# Patient Record
Sex: Female | Born: 1945 | Race: White | Hispanic: No | Marital: Married | Smoking: Never smoker
Health system: Southern US, Community
[De-identification: ages and names within clinical notes are randomized; demographics above are authoritative.]

## PROBLEM LIST (undated history)

## (undated) DIAGNOSIS — K859 Acute pancreatitis without necrosis or infection, unspecified: Secondary | ICD-10-CM

## (undated) DIAGNOSIS — K5790 Diverticulosis of intestine, part unspecified, without perforation or abscess without bleeding: Secondary | ICD-10-CM

## (undated) DIAGNOSIS — I10 Essential (primary) hypertension: Secondary | ICD-10-CM

## (undated) DIAGNOSIS — M1990 Unspecified osteoarthritis, unspecified site: Secondary | ICD-10-CM

## (undated) DIAGNOSIS — E039 Hypothyroidism, unspecified: Secondary | ICD-10-CM

## (undated) DIAGNOSIS — N189 Chronic kidney disease, unspecified: Secondary | ICD-10-CM

## (undated) DIAGNOSIS — K219 Gastro-esophageal reflux disease without esophagitis: Secondary | ICD-10-CM

## (undated) DIAGNOSIS — F419 Anxiety disorder, unspecified: Secondary | ICD-10-CM

## (undated) HISTORY — PX: CARPAL TUNNEL RELEASE: SHX101

---

## 1998-11-09 ENCOUNTER — Other Ambulatory Visit: Admission: RE | Admit: 1998-11-09 | Discharge: 1998-11-09 | Payer: Self-pay | Admitting: Internal Medicine

## 1999-10-30 ENCOUNTER — Other Ambulatory Visit: Admission: RE | Admit: 1999-10-30 | Discharge: 1999-10-30 | Payer: Self-pay | Admitting: Internal Medicine

## 2000-10-29 ENCOUNTER — Other Ambulatory Visit: Admission: RE | Admit: 2000-10-29 | Discharge: 2000-10-29 | Payer: Self-pay | Admitting: Internal Medicine

## 2004-02-20 ENCOUNTER — Other Ambulatory Visit: Admission: RE | Admit: 2004-02-20 | Discharge: 2004-02-20 | Payer: Self-pay | Admitting: Internal Medicine

## 2005-02-20 ENCOUNTER — Other Ambulatory Visit: Admission: RE | Admit: 2005-02-20 | Discharge: 2005-02-20 | Payer: Self-pay | Admitting: Family Medicine

## 2005-03-19 ENCOUNTER — Emergency Department (HOSPITAL_COMMUNITY): Admission: EM | Admit: 2005-03-19 | Discharge: 2005-03-20 | Payer: Self-pay | Admitting: Emergency Medicine

## 2007-04-22 ENCOUNTER — Other Ambulatory Visit: Admission: RE | Admit: 2007-04-22 | Discharge: 2007-04-22 | Payer: Self-pay | Admitting: Internal Medicine

## 2007-08-06 ENCOUNTER — Ambulatory Visit (HOSPITAL_BASED_OUTPATIENT_CLINIC_OR_DEPARTMENT_OTHER): Admission: RE | Admit: 2007-08-06 | Discharge: 2007-08-06 | Payer: Self-pay | Admitting: Orthopedic Surgery

## 2007-10-16 ENCOUNTER — Ambulatory Visit (HOSPITAL_BASED_OUTPATIENT_CLINIC_OR_DEPARTMENT_OTHER): Admission: RE | Admit: 2007-10-16 | Discharge: 2007-10-16 | Payer: Self-pay | Admitting: Orthopedic Surgery

## 2009-06-30 ENCOUNTER — Other Ambulatory Visit: Admission: RE | Admit: 2009-06-30 | Discharge: 2009-06-30 | Payer: Self-pay | Admitting: Internal Medicine

## 2011-04-09 NOTE — Op Note (Signed)
NAME:  Cassandra Williamson, Cassandra Williamson               ACCOUNT NO.:  [id number]   MEDICAL RECORD NO.:  [id number]          PATIENT TYPE:  AMB   LOCATION:  DSC                          FACILITY:  [facility]   PHYSICIAN:  Katy Fitch. Sypher, M.D. DATE OF BIRTH:  01-06-1946   DATE OF PROCEDURE:  08/06/2007  DATE OF DISCHARGE:                               OPERATIVE REPORT   PREOPERATIVE DIAGNOSIS:  Entrapment neuropathy median nerve, right  carpal tunnel.   POSTOPERATIVE DIAGNOSIS:  Entrapment neuropathy median nerve, right  carpal tunnel.   OPERATION:  Release of right transverse carpal ligament.   SURGEON:  Katy Fitch. Sypher, M.D.   ASSISTANT:  Marveen Reeks. Dasnoit, P.A.-C.   ANESTHESIA:  General by LMA.   SUPERVISING ANESTHESIOLOGIST:  Zenon Mayo, M.D.   INDICATIONS:  [name] is a 65 year old woman referred through the  courtesy of Dr. Kirby Funk of [facility] for evaluation and  management of hand numbness.  Clinical examination revealed signs of  bilateral carpal tunnel syndrome.  Electrodiagnostic studies completed  by Dr. Johna Roles confirmed severe right carpal tunnel syndrome and  moderately severe left carpal tunnel syndrome.  Due to a failure to  respond to nonoperative measures, she is brought to the operating room  at this time for release of her right transverse carpal ligament.   PROCEDURE:  [name] is brought to the operating room and placed in  the supine position on the operating table.  Following the induction  general anesthesia by LMA technique, the right arm was prepped with  Betadine soap solution and sterilely draped.  A pneumatic tourniquet was  applied to the proximal right brachium.  Following exsanguination of the  right arm with Esmarch bandage, the arterial tourniquet was inflated to  220 mmHg.   The procedure commenced with a short incision in the line of the ring  finger of the palm.  The patient's subcutaneous tissues were carefully  to the  palmar fascia.  This was split longitudinally to reveal the  common branch of the median nerve.  These were followed back to the  transverse carpal ligament which was gently isolated from the median  nerve.  It was then carefully separated from the dermis and the median  nerve with a Penfield 4 elevator and released with scissors along its  ulnar border extending into the distal forearm.  This widely opened the  carpal canal.  No mass or other predicaments were noted.  There were no  significant bleeding points.   The wound was then repaired with intradermal 3-0 Prolene suture.  A  compressive dressing was applied with a volar plaster splint maintaining  the wrist in 5 degrees dorsiflexion.  There were no apparent  complications.  [name] tolerated the surgery and anesthesia well.  She is transferred to the recovery room with stable vital signs.      Katy Fitch Sypher, M.D.  Electronically Signed     RVS/MEDQ  D:  08/06/2007  T:  08/06/2007  Job:  [id number]

## 2011-04-09 NOTE — Op Note (Signed)
NAME:  Cassandra Williamson, Cassandra Williamson               ACCOUNT NO.:  [id number]   MEDICAL RECORD NO.:  [id number]          PATIENT TYPE:  AMB   LOCATION:  DSC                          FACILITY:  [facility]   PHYSICIAN:  Katy Fitch. Sypher, M.D. DATE OF BIRTH:  29-Aug-1946   DATE OF PROCEDURE:  10/16/2007  DATE OF DISCHARGE:                               OPERATIVE REPORT   PREOPERATIVE DIAGNOSIS:  Entrapped neuropathy, median nerve, left carpal  tunnel.   POSTOPERATIVE DIAGNOSIS:  Entrapped neuropathy, median nerve, left  carpal tunnel.   OPERATION:  Release of left transverse carpal ligament.   SURGEON:  Katy Fitch. Sypher, M.D.   ASSISTANT:  Marveen Reeks. Dasnoit, P.A.-C.   ANESTHESIA:  General by LMA.   SUPERVISING ANESTHESIOLOGIST:  Janetta Hora. Gelene Mink, M.D.   INDICATIONS:  [name] is a 65 year old woman with history of  bilateral carpal tunnel syndrome.  She is status post release of her  right transverse carpal ligament with a very satisfactory outcome.  Preoperative electrodiagnostic studies demonstrated significant left  entrapment neuropathy of the median nerve at wrist level.  Due to a  failure to respond to nonoperative measures, she is now returned to the  operating room for release of her left transverse carpal ligament.   PROCEDURE:  [name] is brought to the operating room and placed in  the supine position on the table.   Following the induction of general anesthesia by LMA technique, the left  arm was prepped with Betadine soap and solution and sterilely draped.  A  pneumatic tourniquet was applied to the proximal left brachium.  Following exsanguination of the limb with an Esmarch bandage, the  arterial tourniquet was inflated to 220 mmHg.  The procedure commenced  with a short incision in the line of the ring finger in the palm.  The  subcutaneous tissues were carefully divided, revealing the palmar  fascia.  This was split longitudinally to the common sensory branch of  the  median nerve.  These were followed back to the transverse carpal  ligament.  The median nerve was gently isolated from the transverse  carpal ligament with the aid of a Penfield 4 Engineer, structural.  The ligament was  released subcutaneously extending into the distal forearm.  The volar  forearm fascia was likewise released subcutaneously.   This maneuver widely opened the carpal canal.  The ulnar bursa was noted  be opaque and fibrotic.  No other masses or anatomic predicaments were  encountered.   The wound was then repaired with intradermal 3-0 Prolene suture.  A  compressive dressing was applied with a volar plaster splint maintaining  the wrist in 5 degrees of dorsiflexion.  For aftercare, [name] was  provided a prescription for Percocet 5 mg 1 p.o. q.4-6h. p.r.n. pain, 20  tablets without refill.   We will see her back for followup in the office in 7-10 days for suture  removal and advancement to an exercise program and strength training.      Katy Fitch Sypher, M.D.  Electronically Signed     RVS/MEDQ  D:  10/16/2007  T:  10/16/2007  Job:  [id number]

## 2011-09-03 LAB — BASIC METABOLIC PANEL
Calcium: 9.1
Chloride: 100
Creatinine, Ser: 0.99
GFR calc non Af Amer: 57 — ABNORMAL LOW

## 2011-09-03 LAB — POCT HEMOGLOBIN-HEMACUE
Hemoglobin: 12.7
Operator id: 128471

## 2011-09-06 LAB — BASIC METABOLIC PANEL
CO2: 26
Glucose, Bld: 70
Potassium: 4.2
Sodium: 140

## 2011-09-06 LAB — POCT HEMOGLOBIN-HEMACUE
Hemoglobin: 13.4
Operator id: 208731

## 2012-08-10 ENCOUNTER — Other Ambulatory Visit (HOSPITAL_COMMUNITY)
Admission: RE | Admit: 2012-08-10 | Discharge: 2012-08-10 | Disposition: A | Discharge status: Home / Self Care | Payer: BC Managed Care – PPO | Source: Ambulatory Visit | Attending: Internal Medicine | Admitting: Internal Medicine

## 2012-08-10 DIAGNOSIS — Z01419 Encounter for gynecological examination (general) (routine) without abnormal findings: Secondary | ICD-10-CM | POA: Insufficient documentation

## 2012-08-10 DIAGNOSIS — Z1151 Encounter for screening for human papillomavirus (HPV): Secondary | ICD-10-CM | POA: Insufficient documentation

## 2012-09-25 ENCOUNTER — Encounter (HOSPITAL_COMMUNITY): Payer: Self-pay | Admitting: *Deleted

## 2012-09-25 DIAGNOSIS — K859 Acute pancreatitis without necrosis or infection, unspecified: Secondary | ICD-10-CM

## 2012-09-25 DIAGNOSIS — K5790 Diverticulosis of intestine, part unspecified, without perforation or abscess without bleeding: Secondary | ICD-10-CM

## 2012-09-25 HISTORY — DX: Acute pancreatitis without necrosis or infection, unspecified: K85.90

## 2012-09-25 HISTORY — DX: Diverticulosis of intestine, part unspecified, without perforation or abscess without bleeding: K57.90

## 2012-09-25 NOTE — Progress Notes (Signed)
09-25-12 1510 Instructed pt. For Procedure, needs meds verified, then chart ready. Pt. To call Pharmacy next week to give names and dosages of meds. Will have responsible driver and  Person x 24 hrs once  Home. Teach back method used. W. Kennon Portela

## 2012-09-28 ENCOUNTER — Encounter (HOSPITAL_COMMUNITY): Payer: Self-pay | Admitting: Pharmacy Technician

## 2012-09-29 NOTE — Progress Notes (Signed)
09-29-12 0930- Info from Dr. Kerney Elbe last EKG cone in '07-no hard copy sent.W. Kennon Portela

## 2012-10-06 ENCOUNTER — Ambulatory Visit (HOSPITAL_COMMUNITY): Payer: BC Managed Care – PPO | Admitting: Anesthesiology

## 2012-10-06 ENCOUNTER — Encounter (HOSPITAL_COMMUNITY)
Admission: RE | Disposition: A | Discharge status: Home / Self Care | Payer: Self-pay | Source: Ambulatory Visit | Attending: Gastroenterology

## 2012-10-06 ENCOUNTER — Encounter (HOSPITAL_COMMUNITY): Payer: Self-pay | Admitting: *Deleted

## 2012-10-06 ENCOUNTER — Encounter (HOSPITAL_COMMUNITY): Payer: Self-pay | Admitting: Anesthesiology

## 2012-10-06 ENCOUNTER — Ambulatory Visit (HOSPITAL_COMMUNITY)
Admission: RE | Admit: 2012-10-06 | Discharge: 2012-10-06 | Disposition: A | Discharge status: Home / Self Care | Payer: BC Managed Care – PPO | Source: Ambulatory Visit | Attending: Gastroenterology | Admitting: Gastroenterology

## 2012-10-06 DIAGNOSIS — I10 Essential (primary) hypertension: Secondary | ICD-10-CM | POA: Insufficient documentation

## 2012-10-06 DIAGNOSIS — Z79899 Other long term (current) drug therapy: Secondary | ICD-10-CM | POA: Insufficient documentation

## 2012-10-06 DIAGNOSIS — K921 Melena: Secondary | ICD-10-CM | POA: Insufficient documentation

## 2012-10-06 DIAGNOSIS — E039 Hypothyroidism, unspecified: Secondary | ICD-10-CM | POA: Insufficient documentation

## 2012-10-06 DIAGNOSIS — K219 Gastro-esophageal reflux disease without esophagitis: Secondary | ICD-10-CM | POA: Insufficient documentation

## 2012-10-06 DIAGNOSIS — D126 Benign neoplasm of colon, unspecified: Secondary | ICD-10-CM | POA: Insufficient documentation

## 2012-10-06 HISTORY — DX: Anxiety disorder, unspecified: F41.9

## 2012-10-06 HISTORY — DX: Essential (primary) hypertension: I10

## 2012-10-06 HISTORY — DX: Gastro-esophageal reflux disease without esophagitis: K21.9

## 2012-10-06 HISTORY — DX: Unspecified osteoarthritis, unspecified site: M19.90

## 2012-10-06 HISTORY — DX: Diverticulosis of intestine, part unspecified, without perforation or abscess without bleeding: K57.90

## 2012-10-06 HISTORY — DX: Acute pancreatitis without necrosis or infection, unspecified: K85.90

## 2012-10-06 HISTORY — DX: Chronic kidney disease, unspecified: N18.9

## 2012-10-06 HISTORY — DX: Hypothyroidism, unspecified: E03.9

## 2012-10-06 HISTORY — PX: COLONOSCOPY WITH PROPOFOL: SHX5780

## 2012-10-06 SURGERY — COLONOSCOPY WITH PROPOFOL
Anesthesia: Monitor Anesthesia Care

## 2012-10-06 MED ORDER — LACTATED RINGERS IV SOLN
INTRAVENOUS | Status: DC | PRN
  Administered 2012-10-06: 13:00:00 via INTRAVENOUS

## 2012-10-06 MED ORDER — KETAMINE HCL 50 MG/ML IJ SOLN
INTRAMUSCULAR | Status: DC | PRN
  Administered 2012-10-06: 50 mg via INTRAMUSCULAR

## 2012-10-06 MED ORDER — EPINEPHRINE HCL 0.1 MG/ML IJ SOLN
INTRAMUSCULAR | Status: AC
  Filled 2012-10-06: qty 10

## 2012-10-06 MED ORDER — LACTATED RINGERS IV SOLN
INTRAVENOUS | Status: DC
  Administered 2012-10-06: 1000 mL via INTRAVENOUS

## 2012-10-06 MED ORDER — FENTANYL CITRATE 0.05 MG/ML IJ SOLN
INTRAMUSCULAR | Status: DC | PRN
  Administered 2012-10-06 (×2): 50 ug via INTRAVENOUS

## 2012-10-06 MED ORDER — SODIUM CHLORIDE 0.9 % IJ SOLN
INTRAMUSCULAR | Status: DC | PRN
  Administered 2012-10-06: 14:00:00

## 2012-10-06 MED ORDER — PROPOFOL 10 MG/ML IV EMUL
INTRAVENOUS | Status: DC | PRN
  Administered 2012-10-06: 180 ug/kg/min via INTRAVENOUS

## 2012-10-06 MED ORDER — MIDAZOLAM HCL 5 MG/5ML IJ SOLN
INTRAMUSCULAR | Status: DC | PRN
  Administered 2012-10-06 (×2): 1 mg via INTRAVENOUS

## 2012-10-06 MED ORDER — PROMETHAZINE HCL 25 MG/ML IJ SOLN: 6.2500 mg | INTRAMUSCULAR | Status: DC | PRN

## 2012-10-06 SURGICAL SUPPLY — 22 items

## 2012-10-06 NOTE — H&P (Signed)
  Problem: Hematochezia.  History: The patient is a 66 year old female born [birth year]. The patient underwent a normal screening colonoscopy in August 2006. She is experiencing intermittent painless hematochezia.  I discussed with her the complications associated with colonoscopy including intestinal bleeding, intestinal perforation, and oversedation.  The patient is scheduled to undergo a diagnostic colonoscopy to evaluate hematochezia.  Chronic medications: Lisinopril. Hydrochlorothiazide. Synthroid. Lipitor. Fluoxetine. Multivitamins.  Past medical and surgical history: Bilateral carpal tunnel repair in 2008. Hypertension. Hypothyroidism. Tympanic disorder. Irritable bowel syndrome. Gastroesophageal reflux. Acute pancreatitis over 20 years. Normal screening colonoscopy in 2006.  Habits: The patient has never smoked cigarettes and occasionally consumes alcohol.  Medication allergies: Sulfa drugs cause fever and increased liver enzymes.  Exam: The patient is alert and lying comfortably on the endoscopy stretcher. Lungs are clear to auscultation. Cardiac exam reveals a regular rhythm without audible murmurs. Abdomen is soft, flat, and nontender to palpation.  Plan: Proceed with diagnostic colonoscopy to evaluate painless hematochezia as scheduled.

## 2012-10-06 NOTE — Anesthesia Postprocedure Evaluation (Signed)
Anesthesia Post Note  Patient: Cassandra Williamson  Procedure(s) Performed: Procedure(s) (LRB): COLONOSCOPY WITH PROPOFOL (N/A)  Anesthesia type: MAC  Patient location: PACU  Post pain: Pain level controlled  Post assessment: Post-op Vital signs reviewed  Last Vitals:  Filed Vitals:   10/06/12 1436  BP: 104/73  Temp:   Resp: 42    Post vital signs: Reviewed  Level of consciousness: sedated  Complications: No apparent anesthesia complications

## 2012-10-06 NOTE — Transfer of Care (Signed)
Immediate Anesthesia Transfer of Care Note  Patient: Cassandra Williamson  Procedure(s) Performed: Procedure(s) (LRB) with comments: COLONOSCOPY WITH PROPOFOL (N/A)  Patient Location: PACU  Anesthesia Type:MAC  Level of Consciousness: sedated  Airway & Oxygen Therapy: Patient Spontanous Breathing and Patient connected to face mask oxygen  Post-op Assessment: Report given to PACU RN and Post -op Vital signs reviewed and stable  Post vital signs: Reviewed and stable  Complications: No apparent anesthesia complications

## 2012-10-06 NOTE — Anesthesia Preprocedure Evaluation (Signed)
Anesthesia Evaluation  Patient identified by MRN, date of birth, ID band Patient awake    Reviewed: Allergy & Precautions, H&P , NPO status , Patient's Chart, lab work & pertinent test results  Airway Mallampati: II TM Distance: >3 FB Neck ROM: Full    Dental  (+) Teeth Intact and Dental Advisory Given   Pulmonary neg pulmonary ROS,  breath sounds clear to auscultation  Pulmonary exam normal       Cardiovascular hypertension, Rhythm:Regular     Neuro/Psych negative neurological ROS  negative psych ROS   GI/Hepatic Neg liver ROS, GERD-  ,  Endo/Other  Hypothyroidism Morbid obesity  Renal/GU Renal disease  negative genitourinary   Musculoskeletal negative musculoskeletal ROS (+)   Abdominal   Peds  Hematology negative hematology ROS (+)   Anesthesia Other Findings   Reproductive/Obstetrics negative OB ROS                           Anesthesia Physical Anesthesia Plan  ASA: II  Anesthesia Plan: MAC   Post-op Pain Management:    Induction: Intravenous  Airway Management Planned: Simple Face Mask  Additional Equipment:   Intra-op Plan:   Post-operative Plan:   Informed Consent: I have reviewed the patients History and Physical, chart, labs and discussed the procedure including the risks, benefits and alternatives for the proposed anesthesia with the patient or authorized representative who has indicated his/her understanding and acceptance.   Dental advisory given  Plan Discussed with: CRNA  Anesthesia Plan Comments:         Anesthesia Quick Evaluation

## 2012-10-06 NOTE — Op Note (Signed)
Procedure: Diagnostic colonoscopy to evaluate painless hematochezia.  Endoscopist: Danise Edge  Premedication: Propofol administered by anesthesia  Procedure: The patient was placed in the left lateral decubitus position. Anal inspection and digital rectal exam were normal. The Pentax pediatric colonoscope was introduced into the rectum and advanced to the cecum. A normal-appearing ileocecal valve and appendiceal orifice were identified. Colonic preparation for the exam today was good.  Rectum. Normal. Retroflex view of the distal rectum normal. Sigmoid colon. There is a 3 cm pedunculated polyp in the distal sigmoid colon at 20 cm from the anal verge. To achieve polyp reduction a total of 8 mL of 1:10,000 epinephrine was injected in the polyp stalk and head. Using the electrocautery snare, the polyp was removed without complications and submitted for pathological evaluation.  Descending colon. Normal.  Splenic flexure. Normal.  Transverse colon. Normal.  Hepatic flexure. Normal.  Ascending colon. Normal.  Cecum and ileocecal valve. Normal.  Assessment: A 3 cm pedunculated polyp was removed with the electrocautery snare from the distal sigmoid colon at 20 cm from the anal verge and submitted for pathological evaluation.  Recommendations: Await pathology report on the submitted distal sigmoid colon polyp.

## 2012-10-07 ENCOUNTER — Encounter (HOSPITAL_COMMUNITY): Payer: Self-pay | Admitting: Gastroenterology

## 2014-08-23 DIAGNOSIS — E785 Hyperlipidemia, unspecified: Secondary | ICD-10-CM | POA: Diagnosis not present

## 2014-08-23 DIAGNOSIS — I10 Essential (primary) hypertension: Secondary | ICD-10-CM | POA: Diagnosis not present

## 2014-08-23 DIAGNOSIS — Z0000 Encounter for general adult medical examination without abnormal findings: Secondary | ICD-10-CM | POA: Diagnosis not present

## 2014-08-23 DIAGNOSIS — E039 Hypothyroidism, unspecified: Secondary | ICD-10-CM | POA: Diagnosis not present

## 2014-08-23 DIAGNOSIS — F410 Panic disorder [episodic paroxysmal anxiety] without agoraphobia: Secondary | ICD-10-CM | POA: Diagnosis not present

## 2014-08-23 DIAGNOSIS — N183 Chronic kidney disease, stage 3 unspecified: Secondary | ICD-10-CM | POA: Diagnosis not present

## 2014-08-23 DIAGNOSIS — Z23 Encounter for immunization: Secondary | ICD-10-CM | POA: Diagnosis not present

## 2015-02-23 DIAGNOSIS — N183 Chronic kidney disease, stage 3 (moderate): Secondary | ICD-10-CM | POA: Diagnosis not present

## 2015-02-23 DIAGNOSIS — I129 Hypertensive chronic kidney disease with stage 1 through stage 4 chronic kidney disease, or unspecified chronic kidney disease: Secondary | ICD-10-CM | POA: Diagnosis not present

## 2015-04-26 ENCOUNTER — Encounter (HOSPITAL_COMMUNITY): Payer: Self-pay | Admitting: Emergency Medicine

## 2015-04-26 ENCOUNTER — Observation Stay (HOSPITAL_COMMUNITY)
Admission: EM | Admit: 2015-04-26 | Discharge: 2015-04-29 | Disposition: A | Discharge status: Home / Self Care | Payer: 59 | Attending: Internal Medicine | Admitting: Internal Medicine

## 2015-04-26 ENCOUNTER — Other Ambulatory Visit (HOSPITAL_COMMUNITY): Payer: Self-pay

## 2015-04-26 ENCOUNTER — Emergency Department (HOSPITAL_COMMUNITY): Payer: 59

## 2015-04-26 DIAGNOSIS — J329 Chronic sinusitis, unspecified: Secondary | ICD-10-CM | POA: Diagnosis present

## 2015-04-26 DIAGNOSIS — I129 Hypertensive chronic kidney disease with stage 1 through stage 4 chronic kidney disease, or unspecified chronic kidney disease: Secondary | ICD-10-CM | POA: Insufficient documentation

## 2015-04-26 DIAGNOSIS — F32A Depression, unspecified: Secondary | ICD-10-CM | POA: Diagnosis present

## 2015-04-26 DIAGNOSIS — R1110 Vomiting, unspecified: Secondary | ICD-10-CM | POA: Diagnosis not present

## 2015-04-26 DIAGNOSIS — E039 Hypothyroidism, unspecified: Secondary | ICD-10-CM | POA: Insufficient documentation

## 2015-04-26 DIAGNOSIS — Z87442 Personal history of urinary calculi: Secondary | ICD-10-CM | POA: Insufficient documentation

## 2015-04-26 DIAGNOSIS — R404 Transient alteration of awareness: Secondary | ICD-10-CM | POA: Diagnosis not present

## 2015-04-26 DIAGNOSIS — H8113 Benign paroxysmal vertigo, bilateral: Secondary | ICD-10-CM | POA: Diagnosis not present

## 2015-04-26 DIAGNOSIS — Z888 Allergy status to other drugs, medicaments and biological substances status: Secondary | ICD-10-CM | POA: Insufficient documentation

## 2015-04-26 DIAGNOSIS — I10 Essential (primary) hypertension: Secondary | ICD-10-CM

## 2015-04-26 DIAGNOSIS — F329 Major depressive disorder, single episode, unspecified: Secondary | ICD-10-CM | POA: Diagnosis present

## 2015-04-26 DIAGNOSIS — Z882 Allergy status to sulfonamides status: Secondary | ICD-10-CM | POA: Insufficient documentation

## 2015-04-26 DIAGNOSIS — R42 Dizziness and giddiness: Principal | ICD-10-CM

## 2015-04-26 DIAGNOSIS — K219 Gastro-esophageal reflux disease without esophagitis: Secondary | ICD-10-CM | POA: Insufficient documentation

## 2015-04-26 DIAGNOSIS — R112 Nausea with vomiting, unspecified: Secondary | ICD-10-CM

## 2015-04-26 DIAGNOSIS — F419 Anxiety disorder, unspecified: Secondary | ICD-10-CM | POA: Diagnosis not present

## 2015-04-26 DIAGNOSIS — J324 Chronic pansinusitis: Secondary | ICD-10-CM

## 2015-04-26 DIAGNOSIS — H8309 Labyrinthitis, unspecified ear: Secondary | ICD-10-CM | POA: Diagnosis present

## 2015-04-26 DIAGNOSIS — N189 Chronic kidney disease, unspecified: Secondary | ICD-10-CM | POA: Insufficient documentation

## 2015-04-26 DIAGNOSIS — E785 Hyperlipidemia, unspecified: Secondary | ICD-10-CM | POA: Diagnosis present

## 2015-04-26 LAB — COMPREHENSIVE METABOLIC PANEL
ALBUMIN: 3.9 g/dL (ref 3.5–5.0)
ALK PHOS: 63 U/L (ref 38–126)
ALT: 37 U/L (ref 14–54)
AST: 30 U/L (ref 15–41)
Anion gap: 11 (ref 5–15)
BUN: 16 mg/dL (ref 6–20)
CALCIUM: 9.1 mg/dL (ref 8.9–10.3)
CO2: 25 mmol/L (ref 22–32)
Chloride: 102 mmol/L (ref 101–111)
Creatinine, Ser: 0.97 mg/dL (ref 0.44–1.00)
GFR calc Af Amer: >60 mL/min (ref >60)
GFR, EST NON AFRICAN AMERICAN: 59 mL/min — AB (ref >60)
Glucose, Bld: 138 mg/dL — ABNORMAL HIGH (ref 65–99)
Potassium: 4.3 mmol/L (ref 3.5–5.1)
Sodium: 138 mmol/L (ref 135–145)
Total Bilirubin: 0.4 mg/dL (ref 0.3–1.2)
Total Protein: 7.1 g/dL (ref 6.5–8.1)

## 2015-04-26 LAB — URINALYSIS, ROUTINE W REFLEX MICROSCOPIC
Bilirubin Urine: NEGATIVE
Glucose, UA: NEGATIVE mg/dL
Hgb urine dipstick: NEGATIVE
Ketones, ur: NEGATIVE mg/dL
Leukocytes, UA: NEGATIVE
Nitrite: NEGATIVE
Protein, ur: NEGATIVE mg/dL
SPECIFIC GRAVITY, URINE: 1.024 (ref 1.005–1.030)
UROBILINOGEN UA: 0.2 mg/dL (ref 0.0–1.0)
pH: 7 (ref 5.0–8.0)

## 2015-04-26 LAB — CBC
HCT: 37 % (ref 36.0–46.0)
Hemoglobin: 12.2 g/dL (ref 12.0–15.0)
MCH: 29.8 pg (ref 26.0–34.0)
MCHC: 33 g/dL (ref 30.0–36.0)
MCV: 90.2 fL (ref 78.0–100.0)
PLATELETS: 308 10*3/uL (ref 150–400)
RBC: 4.1 MIL/uL (ref 3.87–5.11)
RDW: 12.8 % (ref 11.5–15.5)
WBC: 6.4 10*3/uL (ref 4.0–10.5)

## 2015-04-26 LAB — I-STAT CHEM 8, ED
BUN: 20 mg/dL (ref 6–20)
CHLORIDE: 103 mmol/L (ref 101–111)
Calcium, Ion: 1.14 mmol/L (ref 1.13–1.30)
Creatinine, Ser: 0.9 mg/dL (ref 0.44–1.00)
Glucose, Bld: 134 mg/dL — ABNORMAL HIGH (ref 65–99)
HCT: 40 % (ref 36.0–46.0)
HEMOGLOBIN: 13.6 g/dL (ref 12.0–15.0)
Potassium: 4.3 mmol/L (ref 3.5–5.1)
Sodium: 140 mmol/L (ref 135–145)
TCO2: 21 mmol/L (ref 0–100)

## 2015-04-26 LAB — DIFFERENTIAL
Basophils Absolute: 0 10*3/uL (ref 0.0–0.1)
Basophils Relative: 0 % (ref 0–1)
EOS ABS: 0 10*3/uL (ref 0.0–0.7)
Eosinophils Relative: 0 % (ref 0–5)
LYMPHS ABS: 0.6 10*3/uL — AB (ref 0.7–4.0)
Lymphocytes Relative: 9 % — ABNORMAL LOW (ref 12–46)
MONO ABS: 0.2 10*3/uL (ref 0.1–1.0)
MONOS PCT: 3 % (ref 3–12)
Neutro Abs: 5.6 10*3/uL (ref 1.7–7.7)
Neutrophils Relative %: 88 % — ABNORMAL HIGH (ref 43–77)

## 2015-04-26 LAB — APTT: aPTT: 25 seconds (ref 24–37)

## 2015-04-26 LAB — I-STAT TROPONIN, ED: TROPONIN I, POC: 0 ng/mL (ref 0.00–0.08)

## 2015-04-26 LAB — RAPID URINE DRUG SCREEN, HOSP PERFORMED
Amphetamines: NOT DETECTED
BARBITURATES: NOT DETECTED
Benzodiazepines: NOT DETECTED
COCAINE: NOT DETECTED
OPIATES: NOT DETECTED
Tetrahydrocannabinol: NOT DETECTED

## 2015-04-26 LAB — PROTIME-INR
INR: 1.07 (ref 0.00–1.49)
PROTHROMBIN TIME: 14.1 s (ref 11.6–15.2)

## 2015-04-26 LAB — ETHANOL

## 2015-04-26 MED ORDER — FLUTICASONE PROPIONATE 50 MCG/ACT NA SUSP
2.0000 | Freq: Every day | NASAL | Status: DC
  Administered 2015-04-27 – 2015-04-29 (×3): 2 via NASAL
  Filled 2015-04-26 (×2): qty 16

## 2015-04-26 MED ORDER — CALCIUM CARBONATE ANTACID 500 MG PO CHEW
1.0000 | CHEWABLE_TABLET | ORAL | Status: DC | PRN
  Administered 2015-04-27 – 2015-04-29 (×5): 200 mg via ORAL
  Filled 2015-04-26 (×5): qty 1

## 2015-04-26 MED ORDER — ATORVASTATIN CALCIUM 40 MG PO TABS
40.0000 mg | ORAL_TABLET | Freq: Every evening | ORAL | Status: DC
  Administered 2015-04-27 – 2015-04-28 (×2): 40 mg via ORAL
  Filled 2015-04-26 (×2): qty 1

## 2015-04-26 MED ORDER — LORAZEPAM 1 MG PO TABS: 1.0000 mg | ORAL_TABLET | ORAL | Status: DC | PRN

## 2015-04-26 MED ORDER — LORAZEPAM 1 MG PO TABS
1.0000 mg | ORAL_TABLET | Freq: Once | ORAL | Status: AC
Start: 2015-04-26 — End: 2015-04-26
  Administered 2015-04-26: 1 mg via ORAL
  Filled 2015-04-26: qty 1

## 2015-04-26 MED ORDER — ONDANSETRON HCL 4 MG/2ML IJ SOLN
4.0000 mg | Freq: Once | INTRAMUSCULAR | Status: AC
  Administered 2015-04-26: 4 mg via INTRAVENOUS
  Filled 2015-04-26: qty 2

## 2015-04-26 MED ORDER — ADULT MULTIVITAMIN W/MINERALS CH
1.0000 | ORAL_TABLET | Freq: Every day | ORAL | Status: DC
  Administered 2015-04-27 – 2015-04-29 (×3): 1 via ORAL
  Filled 2015-04-26 (×3): qty 1

## 2015-04-26 MED ORDER — FLUOXETINE HCL 20 MG PO CAPS
40.0000 mg | ORAL_CAPSULE | Freq: Every day | ORAL | Status: DC
  Administered 2015-04-27 – 2015-04-29 (×3): 40 mg via ORAL
  Filled 2015-04-26 (×3): qty 2

## 2015-04-26 MED ORDER — NAPROXEN 250 MG PO TABS
250.0000 mg | ORAL_TABLET | Freq: Every day | ORAL | Status: DC | PRN
  Administered 2015-04-27 – 2015-04-28 (×2): 250 mg via ORAL
  Filled 2015-04-26 (×2): qty 1

## 2015-04-26 MED ORDER — HYDROCODONE-ACETAMINOPHEN 5-325 MG PO TABS
1.0000 | ORAL_TABLET | Freq: Once | ORAL | Status: AC
  Administered 2015-04-26: 1 via ORAL
  Filled 2015-04-26: qty 1

## 2015-04-26 MED ORDER — FAMOTIDINE 20 MG PO TABS
20.0000 mg | ORAL_TABLET | Freq: Two times a day (BID) | ORAL | Status: DC
  Administered 2015-04-26 – 2015-04-29 (×6): 20 mg via ORAL
  Filled 2015-04-26 (×6): qty 1

## 2015-04-26 MED ORDER — AMLODIPINE BESYLATE 5 MG PO TABS
5.0000 mg | ORAL_TABLET | Freq: Every day | ORAL | Status: DC
  Administered 2015-04-28 – 2015-04-29 (×2): 5 mg via ORAL
  Filled 2015-04-26 (×3): qty 1

## 2015-04-26 MED ORDER — LEVOTHYROXINE SODIUM 88 MCG PO TABS
88.0000 ug | ORAL_TABLET | Freq: Every day | ORAL | Status: DC
  Administered 2015-04-27 – 2015-04-29 (×3): 88 ug via ORAL
  Filled 2015-04-26 (×3): qty 1

## 2015-04-26 MED ORDER — MECLIZINE HCL 12.5 MG PO TABS
25.0000 mg | ORAL_TABLET | Freq: Three times a day (TID) | ORAL | Status: DC | PRN
  Administered 2015-04-27: 25 mg via ORAL
  Filled 2015-04-26: qty 2

## 2015-04-26 MED ORDER — LISINOPRIL 20 MG PO TABS
20.0000 mg | ORAL_TABLET | Freq: Every day | ORAL | Status: DC
  Administered 2015-04-28 – 2015-04-29 (×2): 20 mg via ORAL
  Filled 2015-04-26 (×3): qty 1

## 2015-04-26 MED ORDER — COLCHICINE 0.6 MG PO TABS
0.6000 mg | ORAL_TABLET | Freq: Every day | ORAL | Status: DC
Start: 2015-04-27 — End: 2015-04-29
  Filled 2015-04-26 (×3): qty 1

## 2015-04-26 NOTE — ED Notes (Signed)
Family at bedside. 

## 2015-04-26 NOTE — H&P (Signed)
[facility] History and Physical  Cassandra Williamson:[id number] DOB: October 11, 1946 DOA: 04/26/2015  Referring physician: EDP PCP: Irven Shelling, MD   Chief Complaint: Dizziness   HPI: Cassandra Williamson is a 69 y.o. female who presents to the ED for evaluation of dizziness.  Symptoms onset yesterday.  She describes it as a "drunk feeling" that worsens when she moves her eyes, her head, or tries to walk.  Recent nasal congestion due to allergies.  No history of vertigo previously.  No headache, focal weakness, paresthesias, chest pain, SOB.  Walking is difficult due to being off ballance.  There is associated nausea and 1 episode of vomiting with trying to walk in the ED.  Review of Systems: Systems reviewed.  As above, otherwise negative  Past Medical History  Diagnosis Date  . Hypertension   . Hypothyroidism   . Anxiety     panic attacks, less recent since tx. Prozac  . Chronic kidney disease     hx. kidney stone at present-no problems x7 yrs  . GERD (gastroesophageal reflux disease)     tx. Zantac, TUMS  . Pancreatitis 09-25-12    30 yrs ago due to BCP pill.  . Diverticulosis 09-25-12    hx. Irritable bowel syndrome  . Arthritis     lower back   Past Surgical History  Procedure Laterality Date  . Carpal tunnel release      bil.  . Colonoscopy with propofol  10/06/2012    Procedure: COLONOSCOPY WITH PROPOFOL;  Surgeon: Garlan Fair, MD;  Location: [facility];  Service: Endoscopy;  Laterality: N/A;   Social History:  reports that she has never smoked. She does not have any smokeless tobacco history on file. She reports that she drinks alcohol. She reports that she does not use illicit drugs.  Allergies  Allergen Reactions  . Sulfa Antibiotics Other (See Comments)    Poisons my internal organs.  . Benadryl [Diphenhydramine Hcl]     Causes insomnia    No family history on file.   Prior to Admission medications   Medication Sig Start Date End Date Taking?  Authorizing Provider  amLODipine (NORVASC) 5 MG tablet Take 5 mg by mouth daily.   Yes Historical Provider, MD  atorvastatin (LIPITOR) 40 MG tablet Take 40 mg by mouth every evening.   Yes Historical Provider, MD  calcium carbonate (TUMS - DOSED IN MG ELEMENTAL CALCIUM) 500 MG chewable tablet Chew 1 tablet by mouth as needed. heartburn   Yes Historical Provider, MD  colchicine 0.6 MG tablet Take 0.6 mg by mouth daily.   Yes Historical Provider, MD  FLUoxetine (PROZAC) 40 MG capsule Take 40 mg by mouth daily before breakfast.   Yes Historical Provider, MD  levothyroxine (SYNTHROID, LEVOTHROID) 88 MCG tablet Take 88 mcg by mouth daily before breakfast.   Yes Historical Provider, MD  lisinopril (PRINIVIL,ZESTRIL) 20 MG tablet Take 20 mg by mouth daily.   Yes Historical Provider, MD  Multiple Vitamin (MULTIVITAMIN WITH MINERALS) TABS Take 1 tablet by mouth daily.   Yes Historical Provider, MD  naproxen sodium (ANAPROX) 220 MG tablet Take 220 mg by mouth daily as needed. For pain   Yes Historical Provider, MD  ranitidine (ZANTAC) 150 MG tablet Take 150 mg by mouth 2 (two) times daily.   Yes Historical Provider, MD   Physical Exam: Filed Vitals:   04/26/15 2230  BP: 110/51  Pulse: 86  Temp:   Resp: 17    BP 110/51 mmHg  Pulse  86  Temp(Src) 98.1 F (36.7 C) (Oral)  Resp 17  Ht 5\' 2"  (1.575 m)  Wt 98.884 kg (218 lb)  BMI 39.86 kg/m2  SpO2 90%  General Appearance:    Alert, oriented, no distress, appears stated age  Head:    Normocephalic, atraumatic  Eyes:    Right horizontal nystagmus, worsens with eye motion to left or right.       Nose:   Nares without drainage or epistaxis. Mucosa, turbinates normal  Throat:   Moist mucous membranes. Oropharynx without erythema or exudate.  Neck:   Supple. No carotid bruits.  No thyromegaly.  No lymphadenopathy.   Back:     No CVA tenderness, no spinal tenderness  Lungs:     Clear to auscultation bilaterally, without wheezes, rhonchi or rales   Chest wall:    No tenderness to palpitation  Heart:    Regular rate and rhythm without murmurs, gallops, rubs  Abdomen:     Soft, non-tender, nondistended, normal bowel sounds, no organomegaly  Genitalia:    deferred  Rectal:    deferred  Extremities:   No clubbing, cyanosis or edema.  Pulses:   2+ and symmetric all extremities  Skin:   Skin color, texture, turgor normal, no rashes or lesions  Lymph nodes:   Cervical, supraclavicular, and axillary nodes normal  Neurologic:   CNII-XII intact. Normal strength, sensation and reflexes      throughout    Labs on Admission:  Basic Metabolic Panel:  Recent Labs Lab 04/26/15 1627 04/26/15 1640  NA 138 140  K 4.3 4.3  CL 102 103  CO2 25  --   GLUCOSE 138* 134*  BUN 16 20  CREATININE 0.97 0.90  CALCIUM 9.1  --    Liver Function Tests:  Recent Labs Lab 04/26/15 1627  AST 30  ALT 37  ALKPHOS 63  BILITOT 0.4  PROT 7.1  ALBUMIN 3.9   No results for input(s): LIPASE, AMYLASE in the last 168 hours. No results for input(s): AMMONIA in the last 168 hours. CBC:  Recent Labs Lab 04/26/15 1627 04/26/15 1640  WBC 6.4  --   NEUTROABS 5.6  --   HGB 12.2 13.6  HCT 37.0 40.0  MCV 90.2  --   PLT 308  --    Cardiac Enzymes: No results for input(s): CKTOTAL, CKMB, CKMBINDEX, TROPONINI in the last 168 hours.  BNP (last 3 results) No results for input(s): PROBNP in the last 8760 hours. CBG: No results for input(s): GLUCAP in the last 168 hours.  Radiological Exams on Admission: Ct Head Wo Contrast  04/26/2015   CLINICAL DATA:  Dizziness.  Vertigo.  Vomiting.  EXAM: CT HEAD WITHOUT CONTRAST  TECHNIQUE: Contiguous axial images were obtained from the base of the skull through the vertex without intravenous contrast.  COMPARISON:  None.  FINDINGS: No evidence of intracranial hemorrhage, brain edema, or other signs of acute infarction. No evidence of intracranial mass lesion or mass effect.  No abnormal extraaxial fluid collections  identified. Ventricles are normal in size. No skull abnormality identified.  Complete opacification of the right maxillary sinuses noted with associated hyperostosis of the walls of the right maxillary sinus. There is also mild mucosal thickening involving the ethmoid sinuses bilaterally.  IMPRESSION: No intracranial abnormality identified.  Chronic sinusitis, with complete opacification and hyperostosis of the right maxillary sinus.   Electronically Signed   By: Earle Gell M.D.   On: 04/26/2015 18:10    EKG: Independently reviewed.  Assessment/Plan Principal Problem:   Benign positional vertigo Active Problems:   Sinusitis   1. BPV - 1. Starting meclizine PRN, ativan PRN if symptoms are very severe and associated with N/V 2. PT eval for Epley maneuvers tomorrow 2. Sinusitis - continue home meds, also adding flonase    Code Status: Full Code  Family Communication: Husband at bedside Disposition Plan: Admit to obs   Time spent: 30 min  GARDNER, JARED M. [facility] Pager [phone]  If 7AM-7PM, please contact the day team taking care of the patient [email or web address] Password [email or web address] 04/26/2015, 10:43 PM

## 2015-04-26 NOTE — ED Provider Notes (Signed)
CSN: [id number]     Arrival date & time 04/26/15  1548 History   First MD Initiated Contact with Patient 04/26/15 1604     Chief Complaint  Patient presents with  . Dizziness     (Consider location/radiation/quality/duration/timing/severity/associated sxs/prior Treatment) HPI   Cassandra Williamson is a 69 y.o. female who presents for evaluation of dizziness which started yesterday. She describes it as a drunk feeling that worsens when she moves her eyes. Her head or tries to walk. She recently has had nasal congestion that she treated to allergies. He has not had vertigo previously. She denies headache, focal weakness, paresthesias, chest pain or shortness of breath. She states that walking is difficult because she feels off balance. She is taking her usual medications. There are no other known modifying factors.   Past Medical History  Diagnosis Date  . Hypertension   . Hypothyroidism   . Anxiety     panic attacks, less recent since tx. Prozac  . Chronic kidney disease     hx. kidney stone at present-no problems x7 yrs  . GERD (gastroesophageal reflux disease)     tx. Zantac, TUMS  . Pancreatitis 09-25-12    30 yrs ago due to BCP pill.  . Diverticulosis 09-25-12    hx. Irritable bowel syndrome  . Arthritis     lower back   Past Surgical History  Procedure Laterality Date  . Carpal tunnel release      bil.  . Colonoscopy with propofol  10/06/2012    Procedure: COLONOSCOPY WITH PROPOFOL;  Surgeon: Garlan Fair, MD;  Location: [facility];  Service: Endoscopy;  Laterality: N/A;   No family history on file. History  Substance Use Topics  . Smoking status: Never Smoker   . Smokeless tobacco: Not on file  . Alcohol Use: Yes     Comment: rare -social   OB History    No data available     Review of Systems  All other systems reviewed and are negative.     Allergies  Sulfa antibiotics and Benadryl  Home Medications   Prior to Admission medications   Medication Sig  Start Date End Date Taking? Authorizing Provider  amLODipine (NORVASC) 5 MG tablet Take 5 mg by mouth daily.   Yes Historical Provider, MD  atorvastatin (LIPITOR) 40 MG tablet Take 40 mg by mouth every evening.   Yes Historical Provider, MD  calcium carbonate (TUMS - DOSED IN MG ELEMENTAL CALCIUM) 500 MG chewable tablet Chew 1 tablet by mouth as needed. heartburn   Yes Historical Provider, MD  colchicine 0.6 MG tablet Take 0.6 mg by mouth daily.   Yes Historical Provider, MD  FLUoxetine (PROZAC) 40 MG capsule Take 40 mg by mouth daily before breakfast.   Yes Historical Provider, MD  levothyroxine (SYNTHROID, LEVOTHROID) 88 MCG tablet Take 88 mcg by mouth daily before breakfast.   Yes Historical Provider, MD  lisinopril (PRINIVIL,ZESTRIL) 20 MG tablet Take 20 mg by mouth daily.   Yes Historical Provider, MD  Multiple Vitamin (MULTIVITAMIN WITH MINERALS) TABS Take 1 tablet by mouth daily.   Yes Historical Provider, MD  naproxen sodium (ANAPROX) 220 MG tablet Take 220 mg by mouth daily as needed. For pain   Yes Historical Provider, MD  ranitidine (ZANTAC) 150 MG tablet Take 150 mg by mouth 2 (two) times daily.   Yes Historical Provider, MD   BP 127/63 mmHg  Pulse 102  Temp(Src) 98.1 F (36.7 C) (Oral)  Resp 13  Ht  5\' 2"  (1.575 m)  Wt 218 lb (98.884 kg)  BMI 39.86 kg/m2  SpO2 93% Physical Exam  Constitutional: She is oriented to person, place, and time. She appears well-developed and well-nourished.  HENT:  Head: Normocephalic and atraumatic.  Right Ear: External ear normal.  Left Ear: External ear normal.  Eyes: Conjunctivae and EOM are normal. Pupils are equal, round, and reactive to light.  Neck: Normal range of motion and phonation normal. Neck supple.  Cardiovascular: Normal rate, regular rhythm and normal heart sounds.   Pulmonary/Chest: Effort normal and breath sounds normal. She exhibits no bony tenderness.  Abdominal: Soft. There is no tenderness.  Musculoskeletal: Normal range  of motion.  Neurological: She is alert and oriented to person, place, and time. No cranial nerve deficit or sensory deficit. She exhibits normal muscle tone. Coordination normal.  No dysarthria or aphasia. Right beating horizontal nystagmus, with bilateral eye motion.  Skin: Skin is warm, dry and intact.  Psychiatric: She has a normal mood and affect. Her behavior is normal. Judgment and thought content normal.  Nursing note and vitals reviewed.   ED Course  Procedures (including critical care time)  Medications  LORazepam (ATIVAN) tablet 1 mg (1 mg Oral Given 04/26/15 1633)  ondansetron (ZOFRAN) injection 4 mg (4 mg Intravenous Given 04/26/15 1754)  HYDROcodone-acetaminophen (NORCO/VICODIN) 5-325 MG per tablet 1 tablet (1 tablet Oral Given 04/26/15 2101)    Patient Vitals for the past 24 hrs:  BP Temp Temp src Pulse Resp SpO2 Height Weight  04/26/15 2045 127/63 mmHg - - 102 13 93 % - -  04/26/15 2015 109/55 mmHg - - 97 14 95 % - -  04/26/15 2000 114/68 mmHg - - 96 14 93 % - -  04/26/15 1945 132/63 mmHg - - 99 14 93 % - -  04/26/15 1930 120/66 mmHg - - 96 15 93 % - -  04/26/15 1915 117/65 mmHg - - 95 18 96 % - -  04/26/15 1900 124/64 mmHg - - 98 14 94 % - -  04/26/15 1845 124/60 mmHg - - 94 15 95 % - -  04/26/15 1815 (!) 121/54 mmHg - - 89 14 93 % - -  04/26/15 1730 138/64 mmHg - - 99 14 96 % - -  04/26/15 1715 (!) 122/50 mmHg - - 95 15 94 % - -  04/26/15 1700 135/58 mmHg - - 97 17 93 % - -  04/26/15 1645 (!) 126/46 mmHg - - 93 (!) 0 92 % - -  04/26/15 1630 (!) 123/54 mmHg - - 93 11 95 % - -  04/26/15 1615 129/59 mmHg - - 99 19 94 % - -  04/26/15 1558 126/61 mmHg 98.1 F (36.7 C) Oral 95 15 97 % 5\' 2"  (1.575 m) 218 lb (98.884 kg)  04/26/15 1553 - - - - - 95 % - -    8:55PM Reevaluation with update and discussion. After initial assessment and treatment, an updated evaluation reveals he states that she has not had any relief from the sinus, after receiving Ativan. She is also still  nauseated. She feels like her head is "too big".  She would like pain medication. [name]   21:50 - an attempt to emulate the patient failed because, the attempt initiated, vomiting, retching, and she was unable to stand up. Reported that the effort made her feel dizzy.  10:13 PM-Consult complete with Dr. Chip Boer. Patient case explained and discussed. He agrees to admit patient for further  evaluation and treatment. Call ended at 2123    Labs Review Labs Reviewed  DIFFERENTIAL - Abnormal; Notable for the following:    Neutrophils Relative % 88 (*)    Lymphocytes Relative 9 (*)    Lymphs Abs 0.6 (*)    All other components within normal limits  COMPREHENSIVE METABOLIC PANEL - Abnormal; Notable for the following:    Glucose, Bld 138 (*)    GFR calc non Af Amer 59 (*)    All other components within normal limits  I-STAT CHEM 8, ED - Abnormal; Notable for the following:    Glucose, Bld 134 (*)    All other components within normal limits  ETHANOL  PROTIME-INR  APTT  CBC  URINE RAPID DRUG SCREEN (HOSP PERFORMED) NOT AT [facility]  URINALYSIS, ROUTINE W REFLEX MICROSCOPIC (NOT AT [facility])  I-STAT TROPOININ, ED    Imaging Review Ct Head Wo Contrast  04/26/2015   CLINICAL DATA:  Dizziness.  Vertigo.  Vomiting.  EXAM: CT HEAD WITHOUT CONTRAST  TECHNIQUE: Contiguous axial images were obtained from the base of the skull through the vertex without intravenous contrast.  COMPARISON:  None.  FINDINGS: No evidence of intracranial hemorrhage, brain edema, or other signs of acute infarction. No evidence of intracranial mass lesion or mass effect.  No abnormal extraaxial fluid collections identified. Ventricles are normal in size. No skull abnormality identified.  Complete opacification of the right maxillary sinuses noted with associated hyperostosis of the walls of the right maxillary sinus. There is also mild mucosal thickening involving the ethmoid sinuses bilaterally.  IMPRESSION: No intracranial  abnormality identified.  Chronic sinusitis, with complete opacification and hyperostosis of the right maxillary sinus.   Electronically Signed   By: Earle Gell M.D.   On: 04/26/2015 18:10     EKG Interpretation None      MDM   Final diagnoses:  Pansinusitis, unspecified chronicity  Nausea and vomiting, vomiting of unspecified type  Dizziness     Positional dizziness likely related to sinusitis. Doubt CVA, serious bacterial infection or metabolic instability. He should feel treatment in the ED, and is unable to ambulate, so will seek admission for treatment and stabilization.   Nursing Notes Reviewed/ Care Coordinated, and agree without changes. Applicable Imaging Reviewed.  Interpretation of Laboratory Data incorporated into ED treatment  Plan: Admit  Daleen Bo, MD 04/26/15 2225

## 2015-04-26 NOTE — ED Notes (Signed)
Pt vomiting in CT. Zofran given in CT.

## 2015-04-26 NOTE — ED Notes (Signed)
MD at bedside. 

## 2015-04-26 NOTE — ED Notes (Addendum)
Pt states yesterday while a work she felt dizzy and off balance. Pt states while walking at work yesterday she felt "drunk" This morning she could barely get out of bed from being dizzy. Pt had one episode of vomiting and diarrhea this morning. 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

## 2015-04-26 NOTE — Progress Notes (Signed)
Patient arrived to 4N20 from ED. Alert and oriented x 4. Oriented to room and unit. Husband at bedside. Will continue to monitor closely. Burnell Blanks, RN

## 2015-04-26 NOTE — ED Notes (Addendum)
Attempted to help pt stand up in order to ambulate down the hall and pt became nauseous and vomited once and continues to dry heave. Dr.wentz made aware.

## 2015-04-27 ENCOUNTER — Observation Stay (HOSPITAL_COMMUNITY): Payer: 59

## 2015-04-27 DIAGNOSIS — F32A Depression, unspecified: Secondary | ICD-10-CM | POA: Diagnosis present

## 2015-04-27 DIAGNOSIS — G939 Disorder of brain, unspecified: Secondary | ICD-10-CM | POA: Diagnosis not present

## 2015-04-27 DIAGNOSIS — F329 Major depressive disorder, single episode, unspecified: Secondary | ICD-10-CM | POA: Diagnosis present

## 2015-04-27 DIAGNOSIS — R112 Nausea with vomiting, unspecified: Secondary | ICD-10-CM | POA: Diagnosis not present

## 2015-04-27 DIAGNOSIS — R42 Dizziness and giddiness: Secondary | ICD-10-CM

## 2015-04-27 DIAGNOSIS — I10 Essential (primary) hypertension: Secondary | ICD-10-CM

## 2015-04-27 DIAGNOSIS — E785 Hyperlipidemia, unspecified: Secondary | ICD-10-CM | POA: Diagnosis present

## 2015-04-27 MED ORDER — PROMETHAZINE HCL 25 MG/ML IJ SOLN
12.5000 mg | Freq: Four times a day (QID) | INTRAMUSCULAR | Status: DC | PRN
  Filled 2015-04-27: qty 1

## 2015-04-27 MED ORDER — MECLIZINE HCL 12.5 MG PO TABS
50.0000 mg | ORAL_TABLET | Freq: Three times a day (TID) | ORAL | Status: DC
  Administered 2015-04-27 – 2015-04-29 (×6): 50 mg via ORAL
  Filled 2015-04-27 (×8): qty 4

## 2015-04-27 MED ORDER — ENOXAPARIN SODIUM 40 MG/0.4ML ~~LOC~~ SOLN
40.0000 mg | SUBCUTANEOUS | Status: DC
  Administered 2015-04-27 – 2015-04-29 (×3): 40 mg via SUBCUTANEOUS
  Filled 2015-04-27 (×3): qty 0.4

## 2015-04-27 MED ORDER — LORAZEPAM 2 MG/ML IJ SOLN
1.0000 mg | Freq: Once | INTRAMUSCULAR | Status: AC
  Administered 2015-04-27: 1 mg via INTRAVENOUS
  Filled 2015-04-27: qty 1

## 2015-04-27 NOTE — Evaluation (Signed)
Physical Therapy Evaluation Patient Details Name: Cassandra Williamson MRN: [id number] DOB: October 29, 1946 Today's Date: 04/27/2015   History of Present Illness  Patient is a 69 yo female admitted 04/26/15 with dizziness, N/V, and difficulty walking.  PMH:  HTN, anxiety, arthritis  Clinical Impression  Patient presents with problems listed below.  Will benefit from acute PT to maximize functional mobility prior to return home with husband.  Today, patient with 10/10 dizziness while sitting EOB.  Patient with ongoing Right beating horizontal nystagmus.  Patient unable to tolerate full testing due to dizziness/nausea.  Will return later pm to continue as patient tolerates.      Follow Up Recommendations Outpatient PT;Supervision/Assistance - 24 hour (For Vestibular Rehab)    Equipment Recommendations  Rolling walker with 5" wheels    Recommendations for Other Services       Precautions / Restrictions Precautions Precautions: Fall Precaution Comments: Dizziness Restrictions Weight Bearing Restrictions: No      Mobility  Bed Mobility Overal bed mobility: Needs Assistance Bed Mobility: Rolling;Sidelying to Sit;Sit to Sidelying Rolling: Supervision Sidelying to sit: Min guard     Sit to sidelying: Min guard General bed mobility comments: As PT entered room, room was dark - patient reports light "bothers" her.  In supine, noted patient with horizontal nystagmus to right.  Verbal cues for mobility technique.  Assist for balance/safety during mobility/transitions.  In sitting, patient required close min guard due to dizziness.  Continued to note right beating horizontal nystagmus.  Patient able to sit EOB x5 minutes with nystagmus ongoing.  Attempted smooth pursuits - difficult to assess.  Patient reported dizziness and nausea increasing with sitting.  Asked to return to sidelying.    Transfers                 General transfer comment: NT per patient request to return to  sidelying.  Ambulation/Gait             General Gait Details: NT - patient did report that 2 nursing staff assisted her to bathroom earlier.  Stairs            Wheelchair Mobility    Modified Rankin (Stroke Patients Only)       Balance Overall balance assessment: Needs assistance Sitting-balance support: Single extremity supported;Feet supported Sitting balance-Leahy Scale: Poor Sitting balance - Comments: Requires support of UE to maintain balance.  Patient reports feeling like she is falling to left.                                       Pertinent Vitals/Pain Pain Assessment: 0-10 Pain Score: 5  Pain Location: Head, above eyes Pain Descriptors / Indicators: Aching;Headache;Pressure (Fullness) Pain Intervention(s): Limited activity within patient's tolerance;Repositioned    Home Living Family/patient expects to be discharged to:: Private residence Living Arrangements: Spouse/significant other Available Help at Discharge: Family;Available 24 hours/day Type of Home: House Home Access: Stairs to enter Entrance Stairs-Rails: None Entrance Stairs-Number of Steps: 1 Home Layout: One level Home Equipment: None      Prior Function Level of Independence: Independent         Comments: Drives; Works as bookkeeper     Journalist, newspaper        Extremity/Trunk Assessment   Upper Extremity Assessment: Overall WFL for tasks assessed           Lower Extremity Assessment: Overall WFL for tasks assessed  Cervical / Trunk Assessment: Normal  Communication   Communication: No difficulties  Cognition Arousal/Alertness: Awake/alert Behavior During Therapy: WFL for tasks assessed/performed Overall Cognitive Status: Within Functional Limits for tasks assessed                      General Comments      Exercises        Assessment/Plan    PT Assessment Patient needs continued PT services  PT Diagnosis Difficulty  walking;Acute pain (Dizziness)   PT Problem List Decreased activity tolerance;Decreased balance;Decreased mobility;Pain  PT Treatment Interventions DME instruction;Gait training;Functional mobility training;Therapeutic activities;Therapeutic exercise;Balance training;Neuromuscular re-education;Patient/family education   PT Goals (Current goals can be found in the Care Plan section) Acute Rehab PT Goals Patient Stated Goal: To feel better PT Goal Formulation: With patient Time For Goal Achievement: 05/04/15 Potential to Achieve Goals: Good    Frequency Min 4X/week   Barriers to discharge        Co-evaluation               End of Session   Activity Tolerance: Patient limited by fatigue;Patient limited by pain (Limited by dizziness) Patient left: in bed;with call bell/phone within reach;with bed alarm set Nurse Communication: Mobility status;Patient requests pain meds (Dizziness medicine)    Functional Assessment Tool Used: Clinical judgement Functional Limitation: Mobility: Walking and moving around Mobility: Walking and Moving Around Current Status [phone]): At least 60 percent but less than 80 percent impaired, limited or restricted Mobility: Walking and Moving Around Goal Status [phone]): At least 20 percent but less than 40 percent impaired, limited or restricted    Time:  10:45-10:51 and 1356-1415 PT Time Calculation (min) (ACUTE ONLY): 6 min + 19 min = 25 min total   Charges:   PT Evaluation $Initial PT Evaluation Tier I: 1 Procedure PT Treatments $Therapeutic Activity: 8-22 mins   PT G Codes:   PT G-Codes **NOT FOR INPATIENT CLASS** Functional Assessment Tool Used: Clinical judgement Functional Limitation: Mobility: Walking and moving around Mobility: Walking and Moving Around Current Status ([id number]): At least 60 percent but less than 80 percent impaired, limited or restricted Mobility: Walking and Moving Around Goal Status [phone]): At least 20 percent but less than  40 percent impaired, limited or restricted    Despina Pole 04/27/2015, 3:48 PM

## 2015-04-27 NOTE — Progress Notes (Signed)
Physical Therapy Treatment Patient Details Name: Cassandra Williamson MRN: [id number] DOB: 07/11/1946 Today's Date: 04/27/2015    History of Present Illness Patient is a 69 yo female admitted 04/26/15 with dizziness, N/V, and difficulty walking.  PMH:  HTN, anxiety, arthritis    PT Comments    Patient continues to have dizziness 7/10 to 10/10.  Spontaneous nystagmus continues, right beating horizontally, though less intense.  Limited assessment due to severity of symptoms today.  Will continue in am.  Follow Up Recommendations  Outpatient PT;Supervision/Assistance - 24 hour (for Vestibular Rehab)     Equipment Recommendations  Rolling walker with 5" wheels    Recommendations for Other Services       Precautions / Restrictions Precautions Precautions: Fall Precaution Comments: Dizziness Restrictions Weight Bearing Restrictions: No    Mobility  Bed Mobility Overal bed mobility: Needs Assistance Bed Mobility: Rolling;Sidelying to Sit;Sit to Sidelying Rolling: Supervision Sidelying to sit: Min guard     Sit to sidelying: Min guard General bed mobility comments: In supine, noted minimal nystagmus with less intensity.  Assist to move to sitting for safety.  Continued to note spontaneous nystagmus, horizontal right beating.  Patient able to perform smooth pursuits and saccades with difficulty.  Unclear if patient overshooting saccades or if nystagmus interfering.  Attempted to perform VOR - patient unable to tolerate.  Patient sat EOB x 5 minutes.  Transfers Overall transfer level: Needs assistance Equipment used: 1 person hand held assist Transfers: Sit to/from Stand Sit to Stand: Min assist         General transfer comment: Patient stood with assist to steady during transfer.  Patient with shaking of LE's and body initially during stance.  Patient reporting dizziness increased from 7/10 to 10/10 in stance.  Able to stand only 20-30 seconds and returned to sitting >  sidelying.  Ambulation/Gait                 Stairs            Wheelchair Mobility    Modified Rankin (Stroke Patients Only)       Balance           Standing balance support: Bilateral upper extremity supported Standing balance-Leahy Scale: Poor                      Cognition Arousal/Alertness: Awake/alert Behavior During Therapy: WFL for tasks assessed/performed Overall Cognitive Status: Within Functional Limits for tasks assessed                      Exercises      General Comments        Pertinent Vitals/Pain Pain Assessment: 0-10 Pain Score: 2  Pain Location: head, above eyes Pain Descriptors / Indicators: Dull Pain Intervention(s): Limited activity within patient's tolerance;Premedicated before session    Home Living                      Prior Function            PT Goals (current goals can now be found in the care plan section) Progress towards PT goals: Progressing toward goals    Frequency  Min 4X/week    PT Plan Current plan remains appropriate    Co-evaluation             End of Session Equipment Utilized During Treatment: Gait belt Activity Tolerance: Patient limited by fatigue (Limited by dizziness) Patient left: in bed;with  call bell/phone within reach;with bed alarm set     Time: 1734-1747 PT Time Calculation (min) (ACUTE ONLY): 13 min  Charges:  $Therapeutic Activity: 8-22 mins                    G Codes:      Despina Pole 05/22/2015, 9:19 PM Carita Pian. Sanjuana Kava, [place] Pager [phone]

## 2015-04-27 NOTE — Progress Notes (Signed)
[facility] PROGRESS NOTE  Cassandra Williamson QMG:[id number] DOB: May 18, 1946 DOA: 04/26/2015 PCP: Irven Shelling, MD  Assessment/Plan  Principal Problem:   Vertigo: no better. Check MRI. R/o CVa. Increase meclizine and schedule. Prn phenergan Active Problems:   Sinusitis   Essential hypertension   Depression   Hyperlipidemia  All home meds continued  HPI/Subjective: Vertigo remains severe. Nausea a bit better. Unable to work with PT due to severity of symptoms  Objective: Filed Vitals:   04/27/15 1400  BP: 121/52  Pulse: 85  Temp: 98.1 F (36.7 C)  Resp: 16   No intake or output data in the 24 hours ending 04/27/15 1431 Filed Weights   04/26/15 1558 04/26/15 2317  Weight: 98.884 kg (218 lb) 98.204 kg (216 lb 8 oz)    Exam:   General:  Nystagmus. uncomfortable  Cardiovascular: RRR  Respiratory: CTA  Abdomen: S, NT, ND  Ext: no CCE  Basic Metabolic Panel:  Recent Labs Lab 04/26/15 1627 04/26/15 1640  NA 138 140  K 4.3 4.3  CL 102 103  CO2 25  --   GLUCOSE 138* 134*  BUN 16 20  CREATININE 0.97 0.90  CALCIUM 9.1  --    Liver Function Tests:  Recent Labs Lab 04/26/15 1627  AST 30  ALT 37  ALKPHOS 63  BILITOT 0.4  PROT 7.1  ALBUMIN 3.9   No results for input(s): LIPASE, AMYLASE in the last 168 hours. No results for input(s): AMMONIA in the last 168 hours. CBC:  Recent Labs Lab 04/26/15 1627 04/26/15 1640  WBC 6.4  --   NEUTROABS 5.6  --   HGB 12.2 13.6  HCT 37.0 40.0  MCV 90.2  --   PLT 308  --    Cardiac Enzymes: No results for input(s): CKTOTAL, CKMB, CKMBINDEX, TROPONINI in the last 168 hours. BNP (last 3 results) No results for input(s): BNP in the last 8760 hours.  ProBNP (last 3 results) No results for input(s): PROBNP in the last 8760 hours.  CBG: No results for input(s): GLUCAP in the last 168 hours.  No results found for this or any previous visit (from the past 240 hour(s)).   Studies: Ct Head Wo  Contrast  04/26/2015   CLINICAL DATA:  Dizziness.  Vertigo.  Vomiting.  EXAM: CT HEAD WITHOUT CONTRAST  TECHNIQUE: Contiguous axial images were obtained from the base of the skull through the vertex without intravenous contrast.  COMPARISON:  None.  FINDINGS: No evidence of intracranial hemorrhage, brain edema, or other signs of acute infarction. No evidence of intracranial mass lesion or mass effect.  No abnormal extraaxial fluid collections identified. Ventricles are normal in size. No skull abnormality identified.  Complete opacification of the right maxillary sinuses noted with associated hyperostosis of the walls of the right maxillary sinus. There is also mild mucosal thickening involving the ethmoid sinuses bilaterally.  IMPRESSION: No intracranial abnormality identified.  Chronic sinusitis, with complete opacification and hyperostosis of the right maxillary sinus.   Electronically Signed   By: Earle Gell M.D.   On: 04/26/2015 18:10    Scheduled Meds: . amLODipine  5 mg Oral Daily  . atorvastatin  40 mg Oral QPM  . colchicine  0.6 mg Oral Daily  . enoxaparin (LOVENOX) injection  40 mg Subcutaneous Q24H  . famotidine  20 mg Oral BID  . FLUoxetine  40 mg Oral QAC breakfast  . fluticasone  2 spray Each Nare Daily  . levothyroxine  88 mcg Oral QAC breakfast  .  lisinopril  20 mg Oral Daily  . LORazepam  1 mg Intravenous Once  . meclizine  50 mg Oral TID  . multivitamin with minerals  1 tablet Oral Daily   Continuous Infusions:   Time spent:25 minutes  Myer Bohlman L  [facility] Pager [phone], password [facility] 04/27/2015, 2:31 PM

## 2015-04-28 DIAGNOSIS — R42 Dizziness and giddiness: Secondary | ICD-10-CM | POA: Diagnosis not present

## 2015-04-28 MED ORDER — ONDANSETRON HCL 4 MG PO TABS
4.0000 mg | ORAL_TABLET | Freq: Three times a day (TID) | ORAL | Status: DC
  Administered 2015-04-28 – 2015-04-29 (×4): 4 mg via ORAL
  Filled 2015-04-28 (×5): qty 1

## 2015-04-28 MED ORDER — AMOXICILLIN-POT CLAVULANATE 875-125 MG PO TABS
1.0000 | ORAL_TABLET | Freq: Two times a day (BID) | ORAL | Status: DC
  Administered 2015-04-28 – 2015-04-29 (×3): 1 via ORAL
  Filled 2015-04-28 (×3): qty 1

## 2015-04-28 MED ORDER — PREDNISONE 20 MG PO TABS
60.0000 mg | ORAL_TABLET | Freq: Every day | ORAL | Status: DC
  Administered 2015-04-28 – 2015-04-29 (×2): 60 mg via ORAL
  Filled 2015-04-28 (×3): qty 3

## 2015-04-28 NOTE — Progress Notes (Signed)
[facility] PROGRESS NOTE  Cassandra Williamson ZSW:[id number] DOB: [birth year] DOA: 04/26/2015 PCP: Cassandra Shelling, MD  Assessment/Plan  Principal Problem:   Vertigo: Slightly better, but still with nausea. MRI negative for stroke. Likely labyrinthitis. Will schedule Zofran. Add steroids Active Problems:   Sinusitis: Patient is having headache, ear pain and might be contributing to the vertigo. Already on Flonase. Will add Augmentin for 5-7 days.   Essential hypertension   Depression   Hyperlipidemia  Hopefully home tomorrow if improving.  HPI/Subjective: Still with vertigo, but slightly improved. Complaining of left ear pain. Does have a frontal headache. Occasional postnasal drip. Nauseated and unable to eat breakfast.  Objective: Filed Vitals:   04/28/15 0928  BP: 140/68  Pulse: 73  Temp: 97.5 F (36.4 C)  Resp: 16   No intake or output data in the 24 hours ending 04/28/15 1038 Filed Weights   04/26/15 1558 04/26/15 2317  Weight: 98.884 kg (218 lb) 98.204 kg (216 lb 8 oz)    Exam:   General:  Nystagmus. Slightly less uncomfortable appearing. More interactive today.  HEENT: bilateral ear canals occluded with cerumen. No frontal sinus tenderness. Tenderness present over the right maxillary sinus.  Cardiovascular: RRR  Respiratory: CTA  Abdomen: S, NT, ND  Ext: no CCE  Basic Metabolic Panel:  Recent Labs Lab 04/26/15 1627 04/26/15 1640  NA 138 140  K 4.3 4.3  CL 102 103  CO2 25  --   GLUCOSE 138* 134*  BUN 16 20  CREATININE 0.97 0.90  CALCIUM 9.1  --    Liver Function Tests:  Recent Labs Lab 04/26/15 1627  AST 30  ALT 37  ALKPHOS 63  BILITOT 0.4  PROT 7.1  ALBUMIN 3.9   No results for input(s): LIPASE, AMYLASE in the last 168 hours. No results for input(s): AMMONIA in the last 168 hours. CBC:  Recent Labs Lab 04/26/15 1627 04/26/15 1640  WBC 6.4  --   NEUTROABS 5.6  --   HGB 12.2 13.6  HCT 37.0 40.0  MCV 90.2  --   PLT 308   --    Cardiac Enzymes: No results for input(s): CKTOTAL, CKMB, CKMBINDEX, TROPONINI in the last 168 hours. BNP (last 3 results) No results for input(s): BNP in the last 8760 hours.  ProBNP (last 3 results) No results for input(s): PROBNP in the last 8760 hours.  CBG: No results for input(s): GLUCAP in the last 168 hours.  No results found for this or any previous visit (from the past 240 hour(s)).   Studies: Ct Head Wo Contrast  04/26/2015   CLINICAL DATA:  Dizziness.  Vertigo.  Vomiting.  EXAM: CT HEAD WITHOUT CONTRAST  TECHNIQUE: Contiguous axial images were obtained from the base of the skull through the vertex without intravenous contrast.  COMPARISON:  None.  FINDINGS: No evidence of intracranial hemorrhage, brain edema, or other signs of acute infarction. No evidence of intracranial mass lesion or mass effect.  No abnormal extraaxial fluid collections identified. Ventricles are normal in size. No skull abnormality identified.  Complete opacification of the right maxillary sinuses noted with associated hyperostosis of the walls of the right maxillary sinus. There is also mild mucosal thickening involving the ethmoid sinuses bilaterally.  IMPRESSION: No intracranial abnormality identified.  Chronic sinusitis, with complete opacification and hyperostosis of the right maxillary sinus.   Electronically Signed   By: Cassandra Williamson M.D.   On: 04/26/2015 18:10   Mr Brain Wo Contrast  04/27/2015   CLINICAL DATA:  Vertigo beginning yesterday.  Nausea and vomiting.  EXAM: MRI HEAD WITHOUT CONTRAST  TECHNIQUE: Multiplanar, multiecho pulse sequences of the brain and surrounding structures were obtained without intravenous contrast.  COMPARISON:  Head CT 04/26/2015  FINDINGS: A partially empty sella is incidentally noted. There is no evidence of acute infarct, intracranial hemorrhage, intra-axial mass, midline shift, or extra-axial fluid collection. Ventricles and sulci are within normal limits for age.  Small, scattered foci of T2 hyperintensity are present throughout the predominantly subcortical cerebral white matter bilaterally, nonspecific but compatible with mild chronic small vessel ischemic disease.  There is a 1.3 cm focus of heterogeneous T2 hyperintensity along the anterolateral aspect of the right frontal lobe (series 6, image 13) which appears to be extra-axial with minimal mass effect on the adjacent right frontal brain parenchyma, although a small amount of coexistent cortical T2 hyperintensity is also possible. There is some associated susceptibility artifact in this region with dense calcification/ossification on CT.  Orbits are unremarkable. Chronic right maxillary sinusitis is noted. There are small bilateral mastoid effusions. Major intracranial vascular flow voids are preserved.  IMPRESSION: 1. No acute intracranial abnormality. 2. Mild chronic small vessel ischemic disease. 3. 1.3 cm right frontal extra-axial lesion, favored to represent an incidental meningioma. This could be further evaluated with nonemergent postcontrast brain MRI.   Electronically Signed   By: Cassandra Williamson   On: 04/27/2015 19:44    Scheduled Meds: . amLODipine  5 mg Oral Daily  . amoxicillin-clavulanate  1 tablet Oral Q12H  . atorvastatin  40 mg Oral QPM  . colchicine  0.6 mg Oral Daily  . enoxaparin (LOVENOX) injection  40 mg Subcutaneous Q24H  . famotidine  20 mg Oral BID  . FLUoxetine  40 mg Oral QAC breakfast  . fluticasone  2 spray Each Nare Daily  . levothyroxine  88 mcg Oral QAC breakfast  . lisinopril  20 mg Oral Daily  . meclizine  50 mg Oral TID  . multivitamin with minerals  1 tablet Oral Daily  . ondansetron  4 mg Oral TID AC   Continuous Infusions:   Time spent:25 minutes  Cassandra Williamson  [facility] Pager [phone], password [email or web address] 04/28/2015, 10:38 AM

## 2015-04-28 NOTE — Progress Notes (Addendum)
Physical Therapy Treatment Patient Details Name: Cassandra Williamson MRN: [id number] DOB: 03-07-1946 Today's Date: 04/28/2015    History of Present Illness Patient is a 69 yo female admitted 04/26/15 with dizziness, N/V, and difficulty walking.  PMH:  HTN, anxiety, arthritis    PT Comments    Patient with continued dizziness today, but with less intensity.  At beginning of session, dizziness was 5/10 - did increase to 10/10 during session.  Continued to note Rt beating horizontal nystagmus, again with less intensity at rest.  Patient able to ambulate 66' with RW and min assist.  Continue to recommend f/u OP PT for Vestibular Rehab at discharge.  Will return this pm for continued therapy.   Follow Up Recommendations  Outpatient PT;Supervision/Assistance - 24 hour (for Vestibular Rehab)     Equipment Recommendations  Rolling walker with 5" wheels    Recommendations for Other Services       Precautions / Restrictions Precautions Precautions: Fall Precaution Comments: Dizziness Restrictions Weight Bearing Restrictions: No    Mobility  Bed Mobility Overal bed mobility: Needs Assistance Bed Mobility: Rolling;Sidelying to Sit;Sit to Sidelying Rolling: Supervision Sidelying to sit: Supervision     Sit to sidelying: Supervision General bed mobility comments: Continued to note Rt horizontal nystagmus but with less intensity this am.  Able to perform Roll Test to both sides.  Patient positive toward right side with geotropic nystagmus.  Nystagmus and dizziness did decline with time, but did not completely resolve.  Roll test to left was positive for slight increase in dizziness.   Patient moved to sitting with supervision for safety.  Able to tolerate smooth pursuits today - normal.  Minimal Rt horizontal nystagmus continues to be present in sitting.  Attempted head thrust - patient unable to tolerate with significant increase in dizziness.  Had patient attempt x1 exercises.  Able to turn head to  both sides only twice and had to stop.  Transfers Overall transfer level: Needs assistance Equipment used: Rolling walker (2 wheeled) Transfers: Sit to/from Stand Sit to Stand: Min assist         General transfer comment: Verbal cues for hand placement.  Min assist to steady during transfer from bed and toilet. Requires UE support for balance.  Ambulation/Gait Ambulation/Gait assistance: Min assist Ambulation Distance (Feet): 86 Feet Assistive device: Rolling walker (2 wheeled) Gait Pattern/deviations: Step-through pattern;Decreased stride length;Drifts right/left Gait velocity: Decreased Gait velocity interpretation: Below normal speed for age/gender General Gait Details: Verbal cues for safe use of RW.   Assist for balance and safety.  Patient drifting toward right side.  Instructed patient to use visual target during gait to decrease dizziness.  Patient reports that helped.   Stairs            Wheelchair Mobility    Modified Rankin (Stroke Patients Only)       Balance                                    Cognition Arousal/Alertness: Awake/alert Behavior During Therapy: WFL for tasks assessed/performed Overall Cognitive Status: Within Functional Limits for tasks assessed                      Exercises Other Exercises Other Exercises: x1 exercises horizontally in sitting    General Comments        Pertinent Vitals/Pain Pain Assessment: No/denies pain    Home Living  Prior Function            PT Goals (current goals can now be found in the care plan section) Progress towards PT goals: Progressing toward goals    Frequency  Min 4X/week    PT Plan Current plan remains appropriate    Co-evaluation             End of Session Equipment Utilized During Treatment: Gait belt Activity Tolerance: Patient tolerated treatment well (Limited by dizziness) Patient left: in bed;with call bell/phone  within reach;with bed alarm set;with family/visitor present     Time: 4680-3212 PT Time Calculation (min) (ACUTE ONLY): 30 min  Charges:  $Gait Training: 8-22 mins $Therapeutic Activity: 8-22 mins                    G Codes:      Despina Pole 05-05-2015, 11:18 AM Carita Pian. Sanjuana Kava, [place] Pager [phone]

## 2015-04-28 NOTE — Progress Notes (Signed)
Patient ambulated to bathroom, no complaints of dizziness at the time. Patient tolerated ambulation well. Patient back in bed, resting. Burnell Blanks, RN

## 2015-04-28 NOTE — Progress Notes (Signed)
Physical Therapy Treatment Patient Details Name: Cassandra Williamson MRN: [id number] DOB: [birth year] Today's Date: 04/28/2015    History of Present Illness Patient is a 69 yo female admitted 04/26/15 with dizziness, N/V, and difficulty walking.  PMH:  HTN, anxiety, arthritis    PT Comments    Patient reports decrease in dizziness.  Able to tolerate additional testing today.  Patient with positive head thrust to left - hypofunction on left.  Reviewed x1 exercises.  Was able to ambulate 140' with RW and min guard assist for safety - using visual targets.  Anticipate patient should be safe for d/c from mobility/PT perspective tomorrow.  Will return for session in am.   Follow Up Recommendations  Outpatient PT;Supervision/Assistance - 24 hour (for Vestibular Rehab)     Equipment Recommendations  Rolling walker with 5" wheels    Recommendations for Other Services       Precautions / Restrictions Precautions Precautions: Fall Precaution Comments: Dizziness Restrictions Weight Bearing Restrictions: No    Mobility  Bed Mobility Overal bed mobility: Needs Assistance Bed Mobility: Rolling;Sidelying to Sit Rolling: Modified independent (Device/Increase time) Sidelying to sit: Supervision       General bed mobility comments: Supervision for safety only.  Reinforced need to sit for several seconds to allow dizziness to decrease before standing.  In sitting, performed VOR and head thrust bilaterally.  Patient with positive head thrust to left today, indicating hypofunction on left.  Performed x1 exercises horizontally and vertically.  Patient tolerating increased head movement.  Transfers Overall transfer level: Needs assistance Equipment used: Rolling walker (2 wheeled) Transfers: Sit to/from Stand Sit to Stand: Min guard         General transfer comment: Assist for safety.  Encouraged patient to stand for several seconds before ambulating for safety.  Ambulation/Gait Ambulation/Gait  assistance: Min guard Ambulation Distance (Feet): 140 Feet Assistive device: Rolling walker (2 wheeled) Gait Pattern/deviations: Step-through pattern;Decreased stride length Gait velocity: Decreased Gait velocity interpretation: Below normal speed for age/gender General Gait Details: Reviewed use of visual target during gait.  Assist for safety   Stairs            Wheelchair Mobility    Modified Rankin (Stroke Patients Only)       Balance                                    Cognition Arousal/Alertness: Awake/alert Behavior During Therapy: WFL for tasks assessed/performed Overall Cognitive Status: Within Functional Limits for tasks assessed                      Exercises Other Exercises Other Exercises: x1 exercises horizontally and vertically in sitting.  Able to do horizontally x 30 seconds, and vertically x 20 seconds.    General Comments        Pertinent Vitals/Pain Pain Assessment: No/denies pain    Home Living                      Prior Function            PT Goals (current goals can now be found in the care plan section) Progress towards PT goals: Progressing toward goals    Frequency  Min 4X/week    PT Plan Current plan remains appropriate    Co-evaluation             End of Session Equipment  Utilized During Treatment: Gait belt Activity Tolerance: Patient tolerated treatment well (Limited by dizziness) Patient left: in chair;with call bell/phone within reach;with nursing/sitter in room     Time: 5625-6389 PT Time Calculation (min) (ACUTE ONLY): 23 min  Charges:  $Gait Training: 8-22 mins $Therapeutic Activity: 8-22 mins                    G Codes:      Despina Pole May 12, 2015, 5:35 PM Carita Pian. Sanjuana Kava, [place] Pager [phone]

## 2015-04-29 DIAGNOSIS — R42 Dizziness and giddiness: Secondary | ICD-10-CM | POA: Diagnosis not present

## 2015-04-29 DIAGNOSIS — H8309 Labyrinthitis, unspecified ear: Secondary | ICD-10-CM | POA: Diagnosis present

## 2015-04-29 MED ORDER — FLUTICASONE PROPIONATE 50 MCG/ACT NA SUSP: 2.0000 | Freq: Every day | NASAL | Status: AC

## 2015-04-29 MED ORDER — ONDANSETRON HCL 4 MG PO TABS: 4.0000 mg | ORAL_TABLET | Freq: Three times a day (TID) | ORAL | Status: DC

## 2015-04-29 MED ORDER — MECLIZINE HCL 50 MG PO TABS: 50.0000 mg | ORAL_TABLET | Freq: Three times a day (TID) | ORAL | Status: DC

## 2015-04-29 MED ORDER — AMOXICILLIN-POT CLAVULANATE 875-125 MG PO TABS: 1.0000 | ORAL_TABLET | Freq: Two times a day (BID) | ORAL | Status: DC

## 2015-04-29 MED ORDER — PREDNISONE 10 MG PO TABS: ORAL_TABLET | ORAL | Status: DC

## 2015-04-29 NOTE — Discharge Summary (Signed)
To Whom It May Concern:   Cassandra Williamson is unable to work from 04/26/15 through 05/12/15 due to illness.   Sincerely,    Doree Barthel, MD [facility]

## 2015-04-29 NOTE — Progress Notes (Signed)
Prescriptions and DC summary reviewed with patient. All questions answered. Patient to be escorted to lobby in [facility] by staff. Husband to transport home in private vehicle.

## 2015-04-29 NOTE — Care Management Note (Signed)
Case Management Note  Patient Details  Name: Cassandra Williamson MRN: [id number] Date of Birth: 03/10/1946  Subjective/Objective:                   Dizziness Action/Plan: Discharge planning  Expected Discharge Date:  04/29/15               Expected Discharge Plan:  Home/Self Care  In-House Referral:     Discharge planning Services  CM Consult  Post Acute Care Choice:    Choice offered to:     DME Arranged:  Walker rolling DME Agency:  [place]:    [place]:     Status of Service:  Completed, signed off  Medicare Important Message Given:    Date Medicare IM Given:    Medicare IM give by:    Date Additional Medicare IM Given:    Additional Medicare Important Message give by:     If discussed at [place] of Stay Meetings, dates discussed:    Additional Comments: CM received call from RN requesting to arrange for a rolling walker.  CM called [facility] DME rep, Jeneen Rinks to please deliver the rolling walker to room so pt can discharge.  No other CM needs were communicated. Dellie Catholic, RN 04/29/2015, 10:42 AM

## 2015-04-29 NOTE — Progress Notes (Signed)
Physical Therapy Treatment Patient Details Name: Cassandra Williamson MRN: [id number] DOB: 09-06-1946 Today's Date: 04/29/2015    History of Present Illness Patient is a 69 yo female admitted 04/26/15 with dizziness, N/V, and difficulty walking.  PMH:  HTN, anxiety, arthritis    PT Comments    Continues to improve with gait, using visual targets and RW.  Practiced x1 exercises horizontally and vertically.  Patient ready for d/c from PT perspective.  Will need RW for use at home.  Will need prescription for OP PT for Vestibular Rehab.  Patient has information to contact facility and set up appointment on Monday.   Follow Up Recommendations  Outpatient PT;Supervision/Assistance - 24 hour (for Vestibular Rehab)     Equipment Recommendations  Rolling walker with 5" wheels    Recommendations for Other Services       Precautions / Restrictions Precautions Precautions: Fall Precaution Comments: Dizziness Restrictions Weight Bearing Restrictions: No    Mobility  Bed Mobility Overal bed mobility: Modified Independent             General bed mobility comments: Reminded patient to sit EOB for several minutes before standing.  Continue to note Rt beating horizontal nystagmus at rest.  Transfers Overall transfer level: Needs assistance Equipment used: Rolling walker (2 wheeled) Transfers: Sit to/from Stand Sit to Stand: Supervision         General transfer comment: Supervision for safety  Ambulation/Gait Ambulation/Gait assistance: Min guard Ambulation Distance (Feet): 150 Feet Assistive device: Rolling walker (2 wheeled) Gait Pattern/deviations: Step-through pattern;Decreased stride length Gait velocity: Decreased Gait velocity interpretation: Below normal speed for age/gender General Gait Details: Reviewed use of visual target during gait.  Assist for safety   Stairs            Wheelchair Mobility    Modified Rankin (Stroke Patients Only)       Balance                                     Cognition Arousal/Alertness: Awake/alert Behavior During Therapy: WFL for tasks assessed/performed Overall Cognitive Status: Within Functional Limits for tasks assessed                      Exercises Other Exercises Other Exercises: x1 exercises horizontally and vertically in sitting.  Able to do horizontally x 30 seconds, and vertically x 20 seconds.    General Comments        Pertinent Vitals/Pain Pain Assessment: No/denies pain    Home Living                      Prior Function            PT Goals (current goals can now be found in the care plan section) Progress towards PT goals: Progressing toward goals    Frequency  Min 4X/week    PT Plan Current plan remains appropriate    Co-evaluation             End of Session   Activity Tolerance: Patient tolerated treatment well (Limited by dizziness) Patient left: in bed;with call bell/phone within reach     Time: 2595-6387 PT Time Calculation (min) (ACUTE ONLY): 16 min  Charges:  $Gait Training: 8-22 mins                    G Codes:  Functional Assessment Tool  Used: Clinical judgement Functional Limitation: Mobility: Walking and moving around Mobility: Walking and Moving Around Goal Status [phone]): At least 20 percent but less than 40 percent impaired, limited or restricted Mobility: Walking and Moving Around Discharge Status [phone]): At least 20 percent but less than 40 percent impaired, limited or restricted   Despina Pole 04/29/2015, 9:16 AM Carita Pian. Sanjuana Kava, [place] Pager [phone]

## 2015-04-29 NOTE — Discharge Summary (Signed)
Physician Discharge Summary  MARWAH DISBRO AYT:[id number] DOB: 1946-07-18 DOA: 04/26/2015  PCP: Irven Shelling, MD  Admit date: 04/26/2015 Discharge date: 04/29/2015  Discharge Diagnoses:  Principal Problem:   Vertigo/labyrinthitis Active Problems:   Acute Sinusitis   Essential hypertension   Depression   Hyperlipidemia   Labyrinthitis   Discharge Condition: stable  Diet recommendation: heart healthy  Filed Weights   04/26/15 1558 04/26/15 2317  Weight: 98.884 kg (218 lb) 98.204 kg (216 lb 8 oz)    History of present illness:  69 y.o. female who presents to the ED for evaluation of dizziness. Symptoms onset yesterday. She describes it as a "drunk feeling" that worsens when she moves her eyes, her head, or tries to walk. Recent nasal congestion due to allergies. No history of vertigo previously. No headache, focal weakness, paresthesias, chest pain, SOB. Walking is difficult due to being off ballance. There is associated nausea and 1 episode of vomiting with trying to walk in the ED.  Hospital Course:  Admitted to observation for severe intractable vertigo.  MRI without stroke.  Started on IVF, antiemetics, meclizine.  Started complaining of sinus pressure, ear pain and post nasal drip. Started on antibiotics and topical steroids for acute sinusitis. Also started on steroids. By discharge, feeling improved, able to eat, ambulate. Has worked with PT and will benefit from outpatient PT for vestibular training. Note for work written.  Procedures:  none  Consultations:  none  Discharge Exam: Filed Vitals:   04/29/15 0543  BP: 132/65  Pulse: 65  Temp: 98.8 F (37.1 C)  Resp: 16    General: a and o Cardiovascular: RRR Respiratory: CTA  Discharge Instructions   Discharge Instructions    Diet - low sodium heart healthy    Complete by:  As directed      Walker     Complete by:  As directed           Current Discharge Medication List    START taking  these medications   Details  amoxicillin-clavulanate (AUGMENTIN) 875-125 MG per tablet Take 1 tablet by mouth every 12 (twelve) hours. Qty: 8 tablet, Refills: 0    fluticasone (FLONASE) 50 MCG/ACT nasal spray Place 2 sprays into both nostrils daily. Qty: 16 g, Refills: 0    meclizine (ANTIVERT) 50 MG tablet Take 1 tablet (50 mg total) by mouth 3 (three) times daily. For vertigo Qty: 40 tablet, Refills: 1    ondansetron (ZOFRAN) 4 MG tablet Take 1 tablet (4 mg total) by mouth 3 (three) times daily before meals. For nausea Qty: 40 tablet, Refills: 1    predniSONE (DELTASONE) 10 MG tablet 5 tab po daily for 2 days, then decrease by 1 tab q2 days until off Qty: 30 tablet, Refills: 0      CONTINUE these medications which have NOT CHANGED   Details  amLODipine (NORVASC) 5 MG tablet Take 5 mg by mouth daily.    atorvastatin (LIPITOR) 40 MG tablet Take 40 mg by mouth every evening.    calcium carbonate (TUMS - DOSED IN MG ELEMENTAL CALCIUM) 500 MG chewable tablet Chew 1 tablet by mouth as needed. heartburn    colchicine 0.6 MG tablet Take 0.6 mg by mouth daily.    FLUoxetine (PROZAC) 40 MG capsule Take 40 mg by mouth daily before breakfast.    levothyroxine (SYNTHROID, LEVOTHROID) 88 MCG tablet Take 88 mcg by mouth daily before breakfast.    lisinopril (PRINIVIL,ZESTRIL) 20 MG tablet Take 20 mg by mouth daily.  Multiple Vitamin (MULTIVITAMIN WITH MINERALS) TABS Take 1 tablet by mouth daily.    naproxen sodium (ANAPROX) 220 MG tablet Take 220 mg by mouth daily as needed. For pain    ranitidine (ZANTAC) 150 MG tablet Take 150 mg by mouth 2 (two) times daily.       Allergies  Allergen Reactions  . Sulfa Antibiotics Other (See Comments)    Poisons my internal organs.  . Benadryl [Diphenhydramine Hcl]     Causes insomnia   Follow-up Information    Follow up with Irven Shelling, MD.   Specialty:  Internal Medicine   Why:  If symptoms worsen   Contact information:   [address] [place] [address] [phone]        The results of significant diagnostics from this hospitalization (including imaging, microbiology, ancillary and laboratory) are listed below for reference.    Significant Diagnostic Studies: Ct Head Wo Contrast  04/26/2015   CLINICAL DATA:  Dizziness.  Vertigo.  Vomiting.  EXAM: CT HEAD WITHOUT CONTRAST  TECHNIQUE: Contiguous axial images were obtained from the base of the skull through the vertex without intravenous contrast.  COMPARISON:  None.  FINDINGS: No evidence of intracranial hemorrhage, brain edema, or other signs of acute infarction. No evidence of intracranial mass lesion or mass effect.  No abnormal extraaxial fluid collections identified. Ventricles are normal in size. No skull abnormality identified.  Complete opacification of the right maxillary sinuses noted with associated hyperostosis of the walls of the right maxillary sinus. There is also mild mucosal thickening involving the ethmoid sinuses bilaterally.  IMPRESSION: No intracranial abnormality identified.  Chronic sinusitis, with complete opacification and hyperostosis of the right maxillary sinus.   Electronically Signed   By: Earle Gell M.D.   On: 04/26/2015 18:10   Mr Brain Wo Contrast  04/27/2015   CLINICAL DATA:  Vertigo beginning yesterday.  Nausea and vomiting.  EXAM: MRI HEAD WITHOUT CONTRAST  TECHNIQUE: Multiplanar, multiecho pulse sequences of the brain and surrounding structures were obtained without intravenous contrast.  COMPARISON:  Head CT 04/26/2015  FINDINGS: A partially empty sella is incidentally noted. There is no evidence of acute infarct, intracranial hemorrhage, intra-axial mass, midline shift, or extra-axial fluid collection. Ventricles and sulci are within normal limits for age. Small, scattered foci of T2 hyperintensity are present throughout the predominantly subcortical cerebral white matter bilaterally, nonspecific but compatible with  mild chronic small vessel ischemic disease.  There is a 1.3 cm focus of heterogeneous T2 hyperintensity along the anterolateral aspect of the right frontal lobe (series 6, image 13) which appears to be extra-axial with minimal mass effect on the adjacent right frontal brain parenchyma, although a small amount of coexistent cortical T2 hyperintensity is also possible. There is some associated susceptibility artifact in this region with dense calcification/ossification on CT.  Orbits are unremarkable. Chronic right maxillary sinusitis is noted. There are small bilateral mastoid effusions. Major intracranial vascular flow voids are preserved.  IMPRESSION: 1. No acute intracranial abnormality. 2. Mild chronic small vessel ischemic disease. 3. 1.3 cm right frontal extra-axial lesion, favored to represent an incidental meningioma. This could be further evaluated with nonemergent postcontrast brain MRI.   Electronically Signed   By: Logan Bores   On: 04/27/2015 19:44    Microbiology: No results found for this or any previous visit (from the past 240 hour(s)).   Labs: Basic Metabolic Panel:  Recent Labs Lab 04/26/15 1627 04/26/15 1640  NA 138 140  K 4.3  4.3  CL 102 103  CO2 25  --   GLUCOSE 138* 134*  BUN 16 20  CREATININE 0.97 0.90  CALCIUM 9.1  --    Liver Function Tests:  Recent Labs Lab 04/26/15 1627  AST 30  ALT 37  ALKPHOS 63  BILITOT 0.4  PROT 7.1  ALBUMIN 3.9   No results for input(s): LIPASE, AMYLASE in the last 168 hours. No results for input(s): AMMONIA in the last 168 hours. CBC:  Recent Labs Lab 04/26/15 1627 04/26/15 1640  WBC 6.4  --   NEUTROABS 5.6  --   HGB 12.2 13.6  HCT 37.0 40.0  MCV 90.2  --   PLT 308  --    Cardiac Enzymes: No results for input(s): CKTOTAL, CKMB, CKMBINDEX, TROPONINI in the last 168 hours. BNP: BNP (last 3 results) No results for input(s): BNP in the last 8760 hours.  ProBNP (last 3 results) No results for input(s): PROBNP in the  last 8760 hours.  CBG: No results for input(s): GLUCAP in the last 168 hours.     SignedDelfina Redwood  [facility] 04/29/2015, 9:34 AM

## 2015-05-15 DIAGNOSIS — H6120 Impacted cerumen, unspecified ear: Secondary | ICD-10-CM | POA: Diagnosis not present

## 2015-05-15 DIAGNOSIS — R42 Dizziness and giddiness: Secondary | ICD-10-CM | POA: Diagnosis not present

## 2015-05-15 DIAGNOSIS — J329 Chronic sinusitis, unspecified: Secondary | ICD-10-CM | POA: Diagnosis not present

## 2015-07-04 DIAGNOSIS — H6123 Impacted cerumen, bilateral: Secondary | ICD-10-CM | POA: Diagnosis not present

## 2015-07-04 DIAGNOSIS — L219 Seborrheic dermatitis, unspecified: Secondary | ICD-10-CM | POA: Diagnosis not present

## 2016-09-20 IMAGING — CT CT HEAD W/O CM
1 series · 16 of 30 positions shown, 20 images · non-contrast
Comparison: None.

CLINICAL DATA: Dizziness.  Vertigo.  Vomiting.

EXAM:
CT HEAD WITHOUT CONTRAST
TECHNIQUE: Contiguous axial images were obtained from the base of the skull
through the vertex without intravenous contrast.

[Series 2: head 5.0 h30s · axial · 0.46mm/px · z∈[-171,-21]mm · 16 of 34 slices shown, 20 images]
[im 2/34  brain]
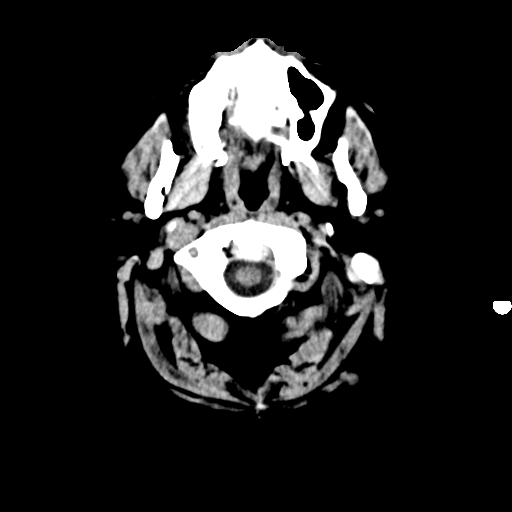
[im 2/34  bone]
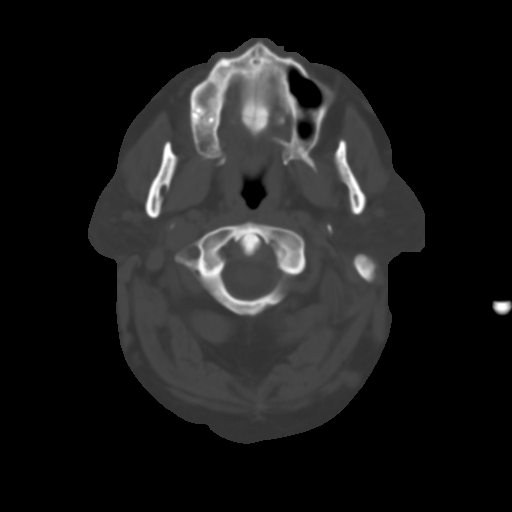
[im 4/34  brain]
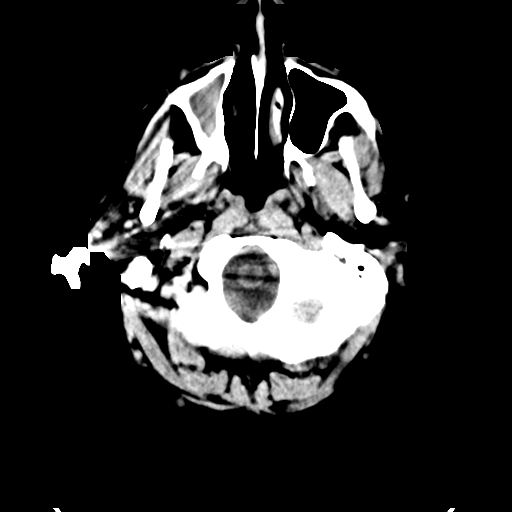
[im 6/34  brain]
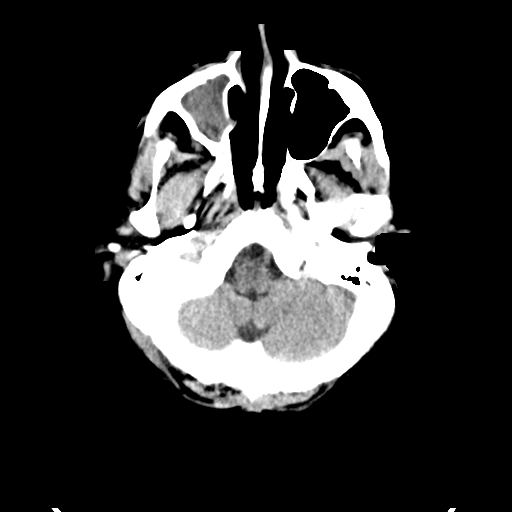
[im 8/34  brain]
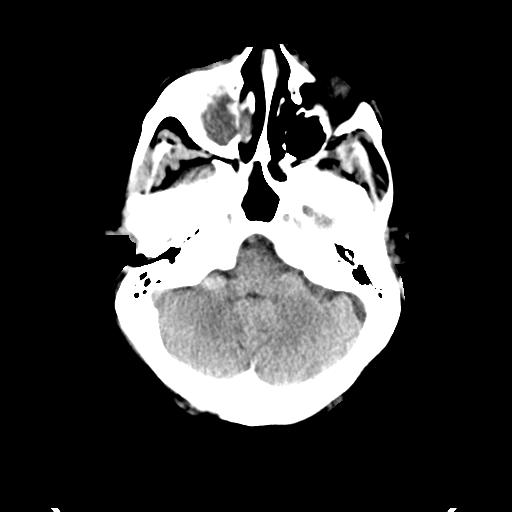
[im 10/34  brain]
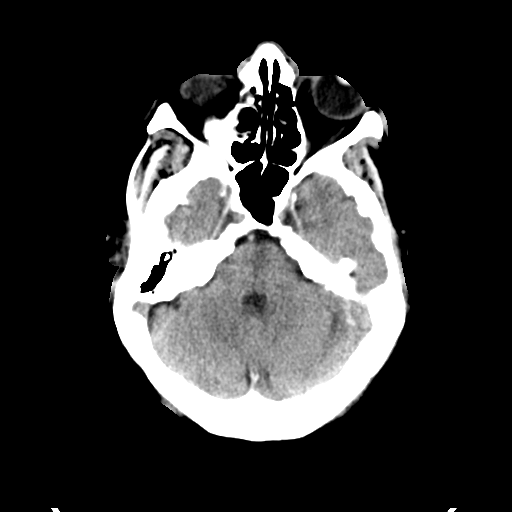
[im 10/34  bone]
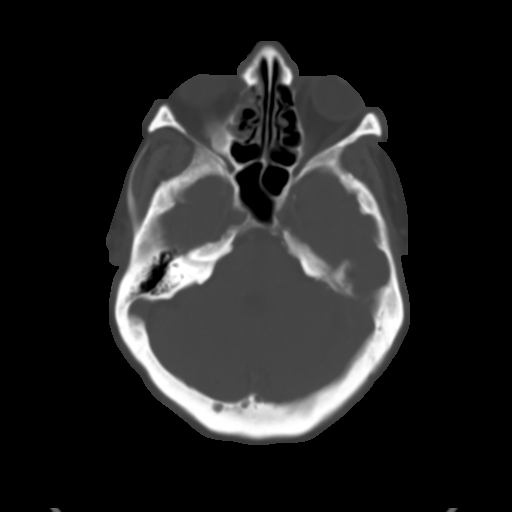
[im 12/34  brain]
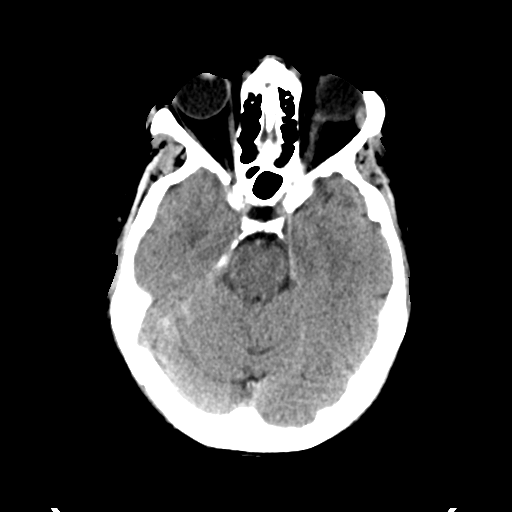
[im 14/34  brain]
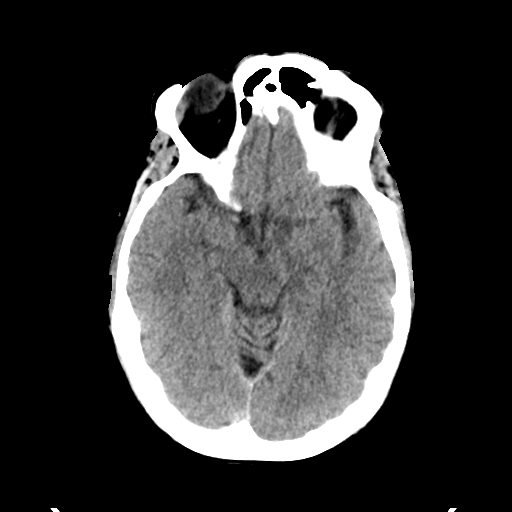
[im 16/34  brain]
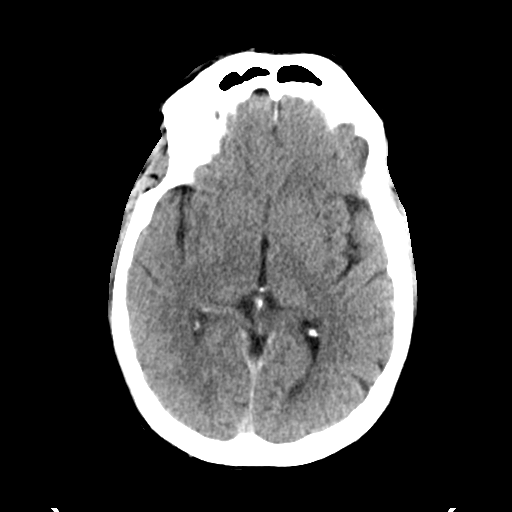
[im 18/34  brain]
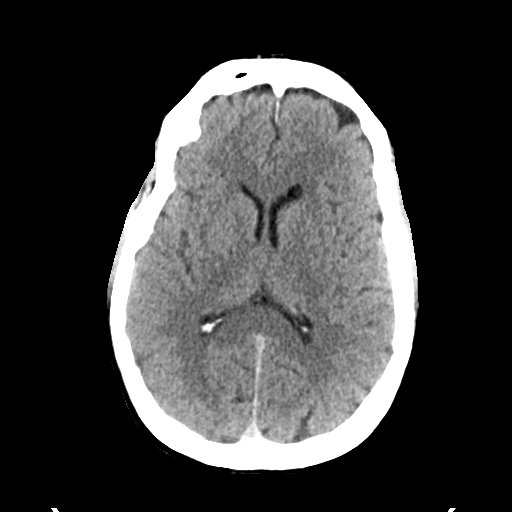
[im 18/34  bone]
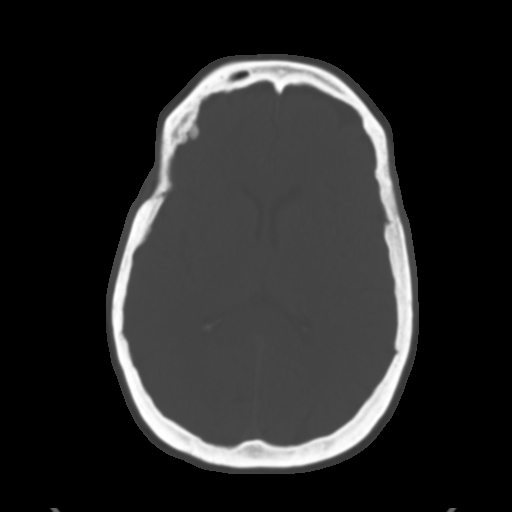
[im 20/34  brain]
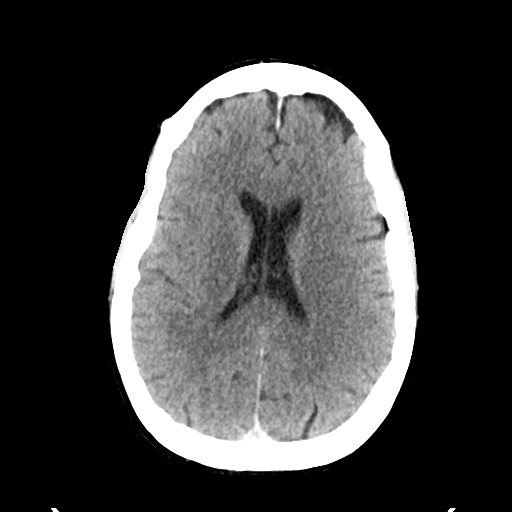
[im 22/34  brain]
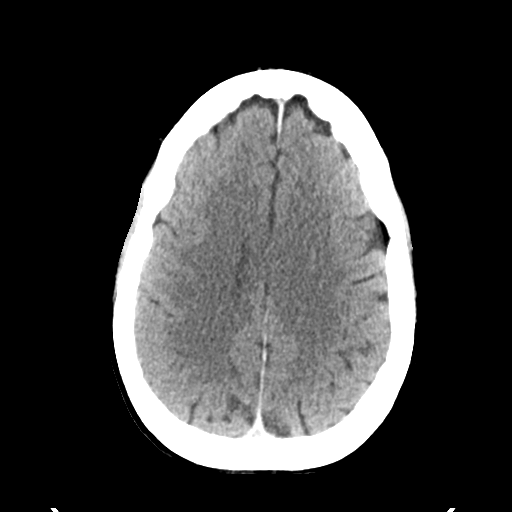
[im 24/34  brain]
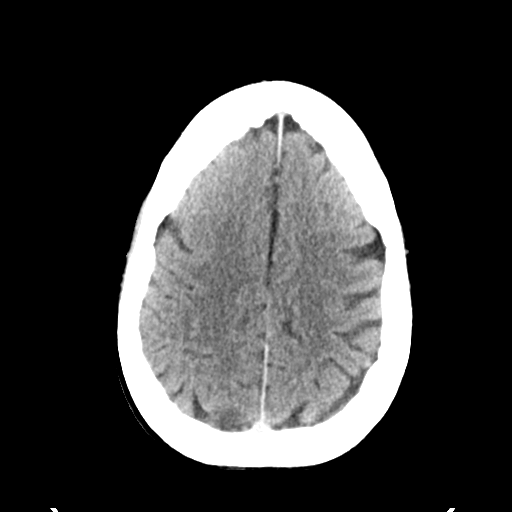
[im 26/34  brain]
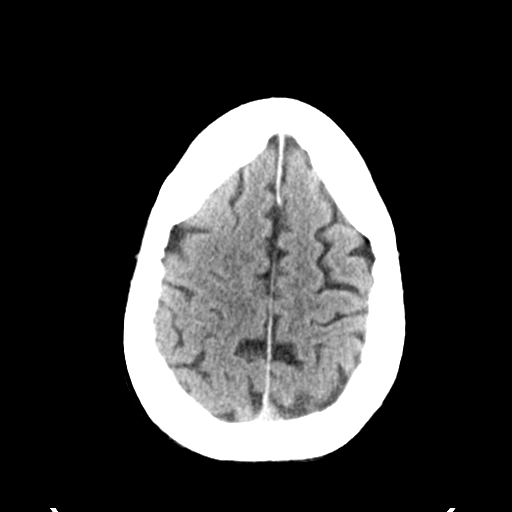
[im 26/34  bone]
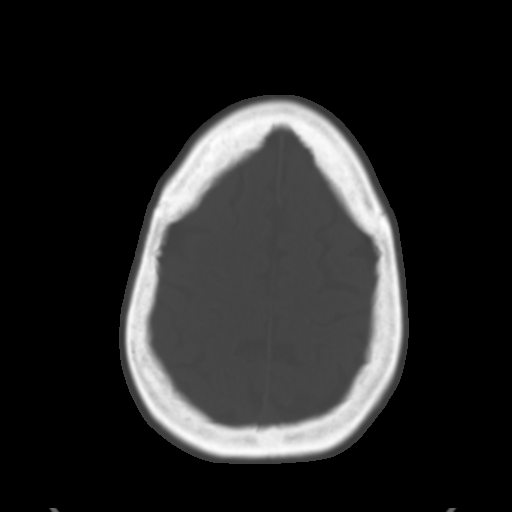
[im 28/34  brain]
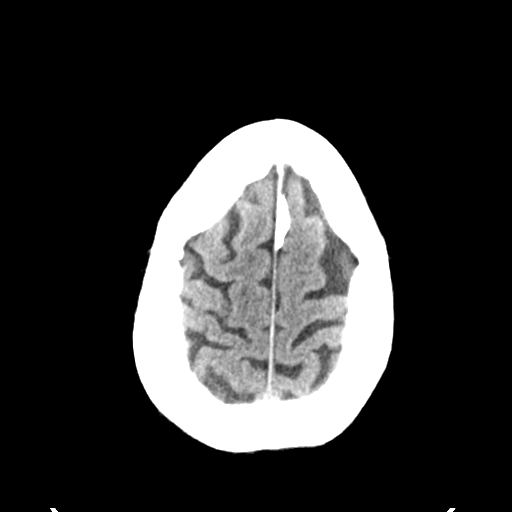
[im 30/34  brain]
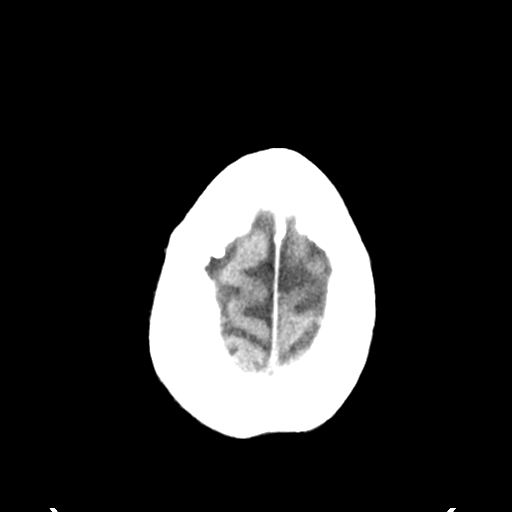
[im 32/34  brain]
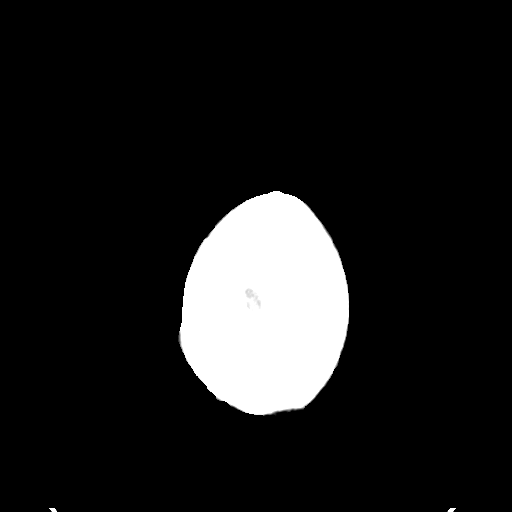

[16 of 30 positions shown; findings below may reference images not displayed]

FINDINGS: No evidence of intracranial hemorrhage, brain edema, or other signs
of acute infarction. No evidence of intracranial mass lesion or mass
effect.

No abnormal extraaxial fluid collections identified. Ventricles are
normal in size. No skull abnormality identified.

Complete opacification of the right maxillary sinuses noted with
associated hyperostosis of the walls of the right maxillary sinus.
There is also mild mucosal thickening involving the ethmoid sinuses
bilaterally.
IMPRESSION: No intracranial abnormality identified.

Chronic sinusitis, with complete opacification and hyperostosis of
the right maxillary sinus.

## 2016-09-26 ENCOUNTER — Other Ambulatory Visit: Payer: Self-pay | Admitting: Gastroenterology

## 2016-10-15 ENCOUNTER — Other Ambulatory Visit: Payer: Self-pay | Admitting: Gastroenterology

## 2016-10-28 ENCOUNTER — Encounter (HOSPITAL_COMMUNITY): Payer: Self-pay

## 2016-11-04 ENCOUNTER — Ambulatory Visit (HOSPITAL_COMMUNITY): Payer: Medicare Other | Admitting: Certified Registered Nurse Anesthetist

## 2016-11-04 ENCOUNTER — Encounter (HOSPITAL_COMMUNITY)
Admission: RE | Disposition: A | Discharge status: Home / Self Care | Payer: Self-pay | Source: Ambulatory Visit | Attending: Gastroenterology

## 2016-11-04 ENCOUNTER — Encounter (HOSPITAL_COMMUNITY): Payer: Self-pay | Admitting: Certified Registered Nurse Anesthetist

## 2016-11-04 ENCOUNTER — Ambulatory Visit (HOSPITAL_COMMUNITY)
Admission: RE | Admit: 2016-11-04 | Discharge: 2016-11-04 | Disposition: A | Discharge status: Home / Self Care | Payer: Medicare Other | Source: Ambulatory Visit | Attending: Gastroenterology | Admitting: Gastroenterology

## 2016-11-04 DIAGNOSIS — K5730 Diverticulosis of large intestine without perforation or abscess without bleeding: Secondary | ICD-10-CM | POA: Insufficient documentation

## 2016-11-04 DIAGNOSIS — Z79899 Other long term (current) drug therapy: Secondary | ICD-10-CM | POA: Insufficient documentation

## 2016-11-04 DIAGNOSIS — I10 Essential (primary) hypertension: Secondary | ICD-10-CM | POA: Insufficient documentation

## 2016-11-04 DIAGNOSIS — E039 Hypothyroidism, unspecified: Secondary | ICD-10-CM | POA: Diagnosis not present

## 2016-11-04 DIAGNOSIS — Z86010 Personal history of colonic polyps: Secondary | ICD-10-CM | POA: Diagnosis not present

## 2016-11-04 DIAGNOSIS — Z1211 Encounter for screening for malignant neoplasm of colon: Secondary | ICD-10-CM | POA: Insufficient documentation

## 2016-11-04 DIAGNOSIS — K219 Gastro-esophageal reflux disease without esophagitis: Secondary | ICD-10-CM | POA: Insufficient documentation

## 2016-11-04 DIAGNOSIS — Z6841 Body Mass Index (BMI) 40.0 and over, adult: Secondary | ICD-10-CM | POA: Diagnosis not present

## 2016-11-04 HISTORY — PX: COLONOSCOPY WITH PROPOFOL: SHX5780

## 2016-11-04 SURGERY — COLONOSCOPY WITH PROPOFOL
Anesthesia: Monitor Anesthesia Care

## 2016-11-04 MED ORDER — SODIUM CHLORIDE 0.9 % IV SOLN: INTRAVENOUS | Status: DC

## 2016-11-04 MED ORDER — PROPOFOL 10 MG/ML IV BOLUS
INTRAVENOUS | Status: DC | PRN
  Administered 2016-11-04 (×3): 20 mg via INTRAVENOUS
  Administered 2016-11-04: 40 mg via INTRAVENOUS
  Administered 2016-11-04: 20 mg via INTRAVENOUS
  Administered 2016-11-04 (×3): 40 mg via INTRAVENOUS
  Administered 2016-11-04: 20 mg via INTRAVENOUS

## 2016-11-04 MED ORDER — PROPOFOL 10 MG/ML IV BOLUS
INTRAVENOUS | Status: AC
  Filled 2016-11-04: qty 40

## 2016-11-04 MED ORDER — LACTATED RINGERS IV SOLN
INTRAVENOUS | Status: DC | PRN
  Administered 2016-11-04: 08:00:00 via INTRAVENOUS

## 2016-11-04 MED ORDER — LIDOCAINE 2% (20 MG/ML) 5 ML SYRINGE
INTRAMUSCULAR | Status: AC
  Filled 2016-11-04: qty 5

## 2016-11-04 MED ORDER — LACTATED RINGERS IV SOLN
INTRAVENOUS | Status: DC
  Administered 2016-11-04: 1000 mL via INTRAVENOUS

## 2016-11-04 SURGICAL SUPPLY — 22 items

## 2016-11-04 NOTE — Op Note (Signed)
[facility] Patient Name: Cassandra Williamson Procedure Date: 11/04/2016 MRN: [id number] Attending MD: Garlan Fair , MD Date of Birth: 1946/04/14 CSN: [id number] Age: 70 Admit Type: Outpatient Procedure:                Colonoscopy Indications:              High risk colon cancer surveillance: Personal                            history of adenoma (10 mm or greater in size) Providers:                Garlan Fair, MD, Cleda Daub, RN, Alfonso Patten, Technician, Dion Saucier, CRNA Referring MD:              Medicines:                Propofol per Anesthesia Complications:            No immediate complications. Estimated Blood Loss:     Estimated blood loss: none. Procedure:                Pre-Anesthesia Assessment:                           - Prior to the procedure, a History and Physical                            was performed, and patient medications and                            allergies were reviewed. The patient's tolerance of                            previous anesthesia was also reviewed. The risks                            and benefits of the procedure and the sedation                            options and risks were discussed with the patient.                            All questions were answered, and informed consent                            was obtained. Prior Anticoagulants: The patient has                            taken no previous anticoagulant or antiplatelet                            agents. ASA Grade Assessment: II - A patient with  mild systemic disease. After reviewing the risks                            and benefits, the patient was deemed in                            satisfactory condition to undergo the procedure.                           After obtaining informed consent, the colonoscope                            was passed under direct vision. Throughout the        procedure, the patient's blood pressure, pulse, and                            oxygen saturations were monitored continuously. The                            [id number] scope was introduced through                            the anus and advanced to the the cecum, identified                            by appendiceal orifice and ileocecal valve. The                            colonoscopy was performed without difficulty. The                            patient tolerated the procedure well. The quality                            of the bowel preparation was good. The terminal                            ileum, the ileocecal valve, the appendiceal orifice                            and the rectum were photographed. Scope In: 9:11:43 AM Scope Out: 9:27:52 AM Scope Withdrawal Time: 0 hours 7 minutes 6 seconds  Total Procedure Duration: 0 hours 16 minutes 9 seconds  Findings:      The perianal and digital rectal examinations were normal.      Multiple small and large-mouthed diverticula were found in the sigmoid       colon. A single diverticulum appeared inflammed.      The exam was otherwise without abnormality. Impression:               - Diverticulosis in the sigmoid colon.                           - The examination was otherwise normal.                           -  No specimens collected. Moderate Sedation:      N/A- Per Anesthesia Care Recommendation:           - Patient has a contact number available for                            emergencies. The signs and symptoms of potential                            delayed complications were discussed with the                            patient. Return to normal activities tomorrow.                            Written discharge instructions were provided to the                            patient.                           - Repeat colonoscopy in 5 years for surveillance.                           - Resume previous diet.                            - Continue present medications. Procedure Code(s):        --- Professional ---                           [id number], Colorectal cancer screening; colonoscopy on                            individual at high risk Diagnosis Code(s):        --- Professional ---                           Z86.010, Personal history of colonic polyps                           K57.30, Diverticulosis of large intestine without                            perforation or abscess without bleeding CPT copyright 2016 [employer]. All rights reserved. The codes documented in this report are preliminary and upon coder review may  be revised to meet current compliance requirements. Earle Gell, MD Garlan Fair, MD 11/04/2016 9:34:13 AM This report has been signed electronically. Number of Addenda: 0

## 2016-11-04 NOTE — Anesthesia Preprocedure Evaluation (Addendum)
Anesthesia Evaluation  Patient identified by MRN, date of birth, ID band Patient awake    Reviewed: Allergy & Precautions, H&P , NPO status , Patient's Chart, lab work & pertinent test results  Airway Mallampati: II  TM Distance: >3 FB Neck ROM: Full    Dental  (+) Teeth Intact, Dental Advisory Given   Pulmonary neg pulmonary ROS,    Pulmonary exam normal breath sounds clear to auscultation       Cardiovascular hypertension, Pt. on medications Normal cardiovascular exam Rhythm:Regular     Neuro/Psych negative neurological ROS  negative psych ROS   GI/Hepatic Neg liver ROS, GERD  ,  Endo/Other  Hypothyroidism Morbid obesity  Renal/GU Renal disease  negative genitourinary   Musculoskeletal negative musculoskeletal ROS (+)   Abdominal   Peds  Hematology negative hematology ROS (+)   Anesthesia Other Findings   Reproductive/Obstetrics negative OB ROS                             Anesthesia Physical  Anesthesia Plan  ASA: III  Anesthesia Plan: MAC   Post-op Pain Management:    Induction: Intravenous  Airway Management Planned: Simple Face Mask  Additional Equipment:   Intra-op Plan:   Post-operative Plan:   Informed Consent: I have reviewed the patients History and Physical, chart, labs and discussed the procedure including the risks, benefits and alternatives for the proposed anesthesia with the patient or authorized representative who has indicated his/her understanding and acceptance.   Dental advisory given  Plan Discussed with: CRNA  Anesthesia Plan Comments:        Anesthesia Quick Evaluation

## 2016-11-04 NOTE — H&P (Signed)
Procedure: Surveillance colonoscopy. 10/06/2012 Baseline screening colonoscopy was performed with removal of a 2 cm pedunculated tubular adenomatous distal sigmoid colon polyp  History: The patient is a 70 year old female born [birth year]. She is scheduled to undergo a surveillance colonoscopy today  Past medical history: Bilateral carpal tunnel repair surgery. Hypertension. Hypothyroidism. Irritable bowel syndrome. Gastroesophageal reflux. Acute pancreatitis diagnosed over 20 years ago.  Family history: One sister diagnosed with colon polyps and a second sister diagnosed with breast cancer  Medication allergies: Sulfa drugs cause fever and elevation in the liver enzymes. Hydrochlorothiazide caused gout.  Exam: The patient is alert and lying comfortably on the endoscopy stretcher. Abdomen is soft and nontender to palpation. Lungs are clear to auscultation. Cardiac exam reveals a regular rhythm.  Plan: Proceed with surveillance colonoscopy

## 2016-11-04 NOTE — Transfer of Care (Signed)
Immediate Anesthesia Transfer of Care Note  Patient: Cassandra Williamson  Procedure(s) Performed: Procedure(s): COLONOSCOPY WITH PROPOFOL (N/A)  Patient Location: PACU and Endoscopy Unit  Anesthesia Type:MAC  Level of Consciousness: awake, oriented, patient cooperative, lethargic and responds to stimulation  Airway & Oxygen Therapy: Patient Spontanous Breathing and Patient connected to face mask oxygen  Post-op Assessment: Report given to RN, Post -op Vital signs reviewed and stable and Patient moving all extremities  Post vital signs: Reviewed and stable  Last Vitals:  Vitals:   11/04/16 0804  BP: (!) 154/52  Pulse: (!) 101  Resp: 17  Temp: 37 C    Last Pain:  Vitals:   11/04/16 0804  TempSrc: Oral         Complications: No apparent anesthesia complications

## 2016-11-04 NOTE — Discharge Instructions (Signed)

## 2016-11-04 NOTE — Anesthesia Postprocedure Evaluation (Signed)
Anesthesia Post Note  Patient: Cassandra Williamson  Procedure(s) Performed: Procedure(s) (LRB): COLONOSCOPY WITH PROPOFOL (N/A)  Patient location during evaluation: PACU Anesthesia Type: MAC Level of consciousness: awake and alert Pain management: pain level controlled Vital Signs Assessment: post-procedure vital signs reviewed and stable Respiratory status: spontaneous breathing, nonlabored ventilation, respiratory function stable and patient connected to nasal cannula oxygen Cardiovascular status: stable and blood pressure returned to baseline Anesthetic complications: no    Last Vitals:  Vitals:   11/04/16 0804 11/04/16 0936  BP: (!) 154/52 (!) 121/93  Pulse: (!) 101 84  Resp: 17 (!) 21  Temp: 37 C 36.4 C    Last Pain:  Vitals:   11/04/16 0936  TempSrc: Leonard Schwartz

## 2016-11-05 ENCOUNTER — Encounter (HOSPITAL_COMMUNITY): Payer: Self-pay | Admitting: Gastroenterology

## 2017-03-31 ENCOUNTER — Other Ambulatory Visit: Payer: Self-pay | Admitting: Nurse Practitioner

## 2017-03-31 DIAGNOSIS — W57XXXA Bitten or stung by nonvenomous insect and other nonvenomous arthropods, initial encounter: Principal | ICD-10-CM

## 2017-03-31 DIAGNOSIS — S1096XA Insect bite of unspecified part of neck, initial encounter: Secondary | ICD-10-CM

## 2017-04-01 ENCOUNTER — Ambulatory Visit
Admission: RE | Admit: 2017-04-01 | Discharge: 2017-04-01 | Disposition: A | Discharge status: Home / Self Care | Payer: Medicare Other | Source: Ambulatory Visit | Attending: Nurse Practitioner | Admitting: Nurse Practitioner

## 2017-04-01 DIAGNOSIS — S1096XA Insect bite of unspecified part of neck, initial encounter: Secondary | ICD-10-CM

## 2017-04-01 DIAGNOSIS — W57XXXA Bitten or stung by nonvenomous insect and other nonvenomous arthropods, initial encounter: Principal | ICD-10-CM

## 2021-01-22 DIAGNOSIS — I129 Hypertensive chronic kidney disease with stage 1 through stage 4 chronic kidney disease, or unspecified chronic kidney disease: Secondary | ICD-10-CM | POA: Diagnosis not present

## 2021-01-22 DIAGNOSIS — E7800 Pure hypercholesterolemia, unspecified: Secondary | ICD-10-CM | POA: Diagnosis not present

## 2021-01-22 DIAGNOSIS — N1831 Chronic kidney disease, stage 3a: Secondary | ICD-10-CM | POA: Diagnosis not present

## 2021-01-22 DIAGNOSIS — E039 Hypothyroidism, unspecified: Secondary | ICD-10-CM | POA: Diagnosis not present

## 2021-01-22 DIAGNOSIS — E781 Pure hyperglyceridemia: Secondary | ICD-10-CM | POA: Diagnosis not present

## 2021-01-22 DIAGNOSIS — I10 Essential (primary) hypertension: Secondary | ICD-10-CM | POA: Diagnosis not present

## 2021-01-22 DIAGNOSIS — N183 Chronic kidney disease, stage 3 unspecified: Secondary | ICD-10-CM | POA: Diagnosis not present

## 2021-03-16 DIAGNOSIS — M109 Gout, unspecified: Secondary | ICD-10-CM | POA: Diagnosis not present

## 2021-03-16 DIAGNOSIS — I129 Hypertensive chronic kidney disease with stage 1 through stage 4 chronic kidney disease, or unspecified chronic kidney disease: Secondary | ICD-10-CM | POA: Diagnosis not present

## 2021-03-16 DIAGNOSIS — R252 Cramp and spasm: Secondary | ICD-10-CM | POA: Diagnosis not present

## 2021-03-16 DIAGNOSIS — N1831 Chronic kidney disease, stage 3a: Secondary | ICD-10-CM | POA: Diagnosis not present

## 2021-05-22 DIAGNOSIS — H25812 Combined forms of age-related cataract, left eye: Secondary | ICD-10-CM | POA: Diagnosis not present

## 2021-05-22 DIAGNOSIS — Z01818 Encounter for other preprocedural examination: Secondary | ICD-10-CM | POA: Diagnosis not present

## 2021-05-22 DIAGNOSIS — H25811 Combined forms of age-related cataract, right eye: Secondary | ICD-10-CM | POA: Diagnosis not present

## 2021-05-31 DIAGNOSIS — H25811 Combined forms of age-related cataract, right eye: Secondary | ICD-10-CM | POA: Diagnosis not present

## 2021-05-31 DIAGNOSIS — H2511 Age-related nuclear cataract, right eye: Secondary | ICD-10-CM | POA: Diagnosis not present

## 2021-06-21 DIAGNOSIS — H2512 Age-related nuclear cataract, left eye: Secondary | ICD-10-CM | POA: Diagnosis not present

## 2021-06-21 DIAGNOSIS — H25812 Combined forms of age-related cataract, left eye: Secondary | ICD-10-CM | POA: Diagnosis not present

## 2021-07-13 DIAGNOSIS — I129 Hypertensive chronic kidney disease with stage 1 through stage 4 chronic kidney disease, or unspecified chronic kidney disease: Secondary | ICD-10-CM | POA: Diagnosis not present

## 2021-07-13 DIAGNOSIS — N183 Chronic kidney disease, stage 3 unspecified: Secondary | ICD-10-CM | POA: Diagnosis not present

## 2021-07-13 DIAGNOSIS — I10 Essential (primary) hypertension: Secondary | ICD-10-CM | POA: Diagnosis not present

## 2021-07-13 DIAGNOSIS — E039 Hypothyroidism, unspecified: Secondary | ICD-10-CM | POA: Diagnosis not present

## 2021-07-13 DIAGNOSIS — E781 Pure hyperglyceridemia: Secondary | ICD-10-CM | POA: Diagnosis not present

## 2021-07-13 DIAGNOSIS — E7800 Pure hypercholesterolemia, unspecified: Secondary | ICD-10-CM | POA: Diagnosis not present

## 2021-09-19 DIAGNOSIS — Z86010 Personal history of colonic polyps: Secondary | ICD-10-CM | POA: Diagnosis not present

## 2021-09-19 DIAGNOSIS — E039 Hypothyroidism, unspecified: Secondary | ICD-10-CM | POA: Diagnosis not present

## 2021-09-19 DIAGNOSIS — E7800 Pure hypercholesterolemia, unspecified: Secondary | ICD-10-CM | POA: Diagnosis not present

## 2021-09-19 DIAGNOSIS — I129 Hypertensive chronic kidney disease with stage 1 through stage 4 chronic kidney disease, or unspecified chronic kidney disease: Secondary | ICD-10-CM | POA: Diagnosis not present

## 2021-09-19 DIAGNOSIS — Z23 Encounter for immunization: Secondary | ICD-10-CM | POA: Diagnosis not present

## 2021-09-19 DIAGNOSIS — N1831 Chronic kidney disease, stage 3a: Secondary | ICD-10-CM | POA: Diagnosis not present

## 2021-09-19 DIAGNOSIS — Z1389 Encounter for screening for other disorder: Secondary | ICD-10-CM | POA: Diagnosis not present

## 2021-09-19 DIAGNOSIS — M109 Gout, unspecified: Secondary | ICD-10-CM | POA: Diagnosis not present

## 2021-09-19 DIAGNOSIS — Z0000 Encounter for general adult medical examination without abnormal findings: Secondary | ICD-10-CM | POA: Diagnosis not present

## 2021-11-08 DIAGNOSIS — Z1231 Encounter for screening mammogram for malignant neoplasm of breast: Secondary | ICD-10-CM | POA: Diagnosis not present

## 2022-02-21 DIAGNOSIS — R21 Rash and other nonspecific skin eruption: Secondary | ICD-10-CM | POA: Diagnosis not present

## 2022-02-21 DIAGNOSIS — I10 Essential (primary) hypertension: Secondary | ICD-10-CM | POA: Diagnosis not present

## 2022-02-21 DIAGNOSIS — M109 Gout, unspecified: Secondary | ICD-10-CM | POA: Diagnosis not present

## 2022-08-06 DIAGNOSIS — H16223 Keratoconjunctivitis sicca, not specified as Sjogren's, bilateral: Secondary | ICD-10-CM | POA: Diagnosis not present

## 2022-08-06 DIAGNOSIS — Z961 Presence of intraocular lens: Secondary | ICD-10-CM | POA: Diagnosis not present

## 2022-09-26 DIAGNOSIS — M109 Gout, unspecified: Secondary | ICD-10-CM | POA: Diagnosis not present

## 2022-09-26 DIAGNOSIS — E7800 Pure hypercholesterolemia, unspecified: Secondary | ICD-10-CM | POA: Diagnosis not present

## 2022-09-26 DIAGNOSIS — Z0000 Encounter for general adult medical examination without abnormal findings: Secondary | ICD-10-CM | POA: Diagnosis not present

## 2022-09-26 DIAGNOSIS — Z23 Encounter for immunization: Secondary | ICD-10-CM | POA: Diagnosis not present

## 2022-09-26 DIAGNOSIS — E039 Hypothyroidism, unspecified: Secondary | ICD-10-CM | POA: Diagnosis not present

## 2022-09-26 DIAGNOSIS — I129 Hypertensive chronic kidney disease with stage 1 through stage 4 chronic kidney disease, or unspecified chronic kidney disease: Secondary | ICD-10-CM | POA: Diagnosis not present

## 2022-09-26 DIAGNOSIS — Z79899 Other long term (current) drug therapy: Secondary | ICD-10-CM | POA: Diagnosis not present

## 2022-11-12 DIAGNOSIS — Z780 Asymptomatic menopausal state: Secondary | ICD-10-CM | POA: Diagnosis not present

## 2022-11-12 DIAGNOSIS — Z1231 Encounter for screening mammogram for malignant neoplasm of breast: Secondary | ICD-10-CM | POA: Diagnosis not present

## 2023-03-27 DIAGNOSIS — N1831 Chronic kidney disease, stage 3a: Secondary | ICD-10-CM | POA: Diagnosis not present

## 2023-03-27 DIAGNOSIS — E039 Hypothyroidism, unspecified: Secondary | ICD-10-CM | POA: Diagnosis not present

## 2023-03-27 DIAGNOSIS — I10 Essential (primary) hypertension: Secondary | ICD-10-CM | POA: Diagnosis not present

## 2023-04-04 DIAGNOSIS — R011 Cardiac murmur, unspecified: Secondary | ICD-10-CM | POA: Diagnosis not present

## 2023-05-19 ENCOUNTER — Emergency Department (HOSPITAL_COMMUNITY)
Admission: EM | Admit: 2023-05-19 | Discharge: 2023-05-19 | Disposition: A | Discharge status: Home / Self Care | Payer: Medicare Other | Attending: Emergency Medicine | Admitting: Emergency Medicine

## 2023-05-19 ENCOUNTER — Other Ambulatory Visit: Payer: Self-pay

## 2023-05-19 ENCOUNTER — Emergency Department (HOSPITAL_COMMUNITY): Payer: Medicare Other

## 2023-05-19 DIAGNOSIS — R6889 Other general symptoms and signs: Secondary | ICD-10-CM | POA: Diagnosis not present

## 2023-05-19 DIAGNOSIS — W19XXXA Unspecified fall, initial encounter: Secondary | ICD-10-CM | POA: Diagnosis not present

## 2023-05-19 DIAGNOSIS — M1712 Unilateral primary osteoarthritis, left knee: Secondary | ICD-10-CM | POA: Diagnosis not present

## 2023-05-19 DIAGNOSIS — Z743 Need for continuous supervision: Secondary | ICD-10-CM | POA: Diagnosis not present

## 2023-05-19 DIAGNOSIS — M2392 Unspecified internal derangement of left knee: Secondary | ICD-10-CM | POA: Diagnosis not present

## 2023-05-19 DIAGNOSIS — M85862 Other specified disorders of bone density and structure, left lower leg: Secondary | ICD-10-CM | POA: Diagnosis not present

## 2023-05-19 DIAGNOSIS — M25562 Pain in left knee: Secondary | ICD-10-CM | POA: Diagnosis not present

## 2023-05-19 MED ORDER — ACETAMINOPHEN 500 MG PO TABS
1000.0000 mg | ORAL_TABLET | Freq: Once | ORAL | Status: AC
  Administered 2023-05-19: 1000 mg via ORAL
  Filled 2023-05-19: qty 2

## 2023-05-19 NOTE — ED Provider Notes (Signed)
EMERGENCY DEPARTMENT AT [facility] Provider Note   CSN: [id number] Arrival date & time: 05/19/23  1812     History  No chief complaint on file.   Cassandra Williamson is a 77 y.o. female.  HPI Patient reports her left knee was a little bit sore for couple days but nothing meaningful.  She did not feel that she needed to see the doctor.  She did not have swelling or redness.  She reports that she had just gone in to get some food in the kitchen and was walking back to the other room when her left knee just suddenly buckled and gave way.  She was able to catch herself by grabbing some furniture.  She reports that the knee at that moment had severe pain and felt like it was being "ripped off".  He reports since then she has a lot of pain if she puts weight directly on the knee.  If she is not weightbearing she can move it around and is not having severe pain but as soon as she tries to put all her weight on it it is intense.    Home Medications Prior to Admission medications   Medication Sig Start Date End Date Taking? Authorizing Provider  acetaminophen (TYLENOL) 500 MG tablet Take 1,000 mg by mouth every 6 (six) hours as needed for headache.    [provider]  amLODipine (NORVASC) 5 MG tablet Take 5 mg by mouth daily.    [provider]  atorvastatin (LIPITOR) 40 MG tablet Take 40 mg by mouth daily.     [provider]  calcium carbonate (TUMS - DOSED IN MG ELEMENTAL CALCIUM) 500 MG chewable tablet Chew 1 tablet by mouth as needed for indigestion or heartburn.     [provider]  Dextromethorphan-Guaifenesin (MUCINEX DM PO) Take 1 tablet by mouth daily as needed (congestion).    [provider]  FLUoxetine (PROZAC) 40 MG capsule Take 40 mg by mouth daily before breakfast.    [provider]  fluticasone (FLONASE) 50 MCG/ACT nasal spray Place 2 sprays into both nostrils daily. Patient taking differently: Place 2  sprays into both nostrils as needed for allergies.  04/29/15   Christiane Ha, MD  levothyroxine (SYNTHROID, LEVOTHROID) 88 MCG tablet Take 88 mcg by mouth daily before breakfast.    [provider]  lisinopril (PRINIVIL,ZESTRIL) 20 MG tablet Take 20 mg by mouth daily.    [provider]  LORazepam (ATIVAN) 0.5 MG tablet Take 0.5 mg by mouth as needed for anxiety.     [provider]  Multiple Vitamin (MULTIVITAMIN WITH MINERALS) TABS Take 1 tablet by mouth daily.    [provider]  Phenyleph-Doxylamine-DM-APAP (ALKA SELTZER PLUS PO) Take 2 tablets by mouth daily as needed (cold symptoms).    [provider]  ranitidine (ZANTAC) 150 MG tablet Take 150 mg by mouth daily as needed for heartburn.     [provider]      Allergies    Sulfa antibiotics and Benadryl [diphenhydramine hcl]    Review of Systems   Review of Systems  Physical Exam Updated Vital Signs BP (!) 157/77 (BP Location: Left Arm)   Pulse 96   Temp 98.9 F (37.2 C) (Oral)   Resp 18   SpO2 99%  Physical Exam Constitutional:      Comments: Alert nontoxic clinically well in appearance.  HENT:     Head: Normocephalic and atraumatic.  Eyes:  Extraocular Movements: Extraocular movements intact.  Pulmonary:     Effort: Pulmonary effort is normal.  Musculoskeletal:        General: Normal range of motion.     Comments: Bilateral lower extremities are symmetric.  Musculature is good.  No peripheral edema.  No knee effusions.  Good definition to the knees.  Patient has normal range of motion.  She is flexing and extending the knee without difficulty bilaterally.  No deformity at the ankle.  No pain at the ankle.  Pain is elicited in the left knee by having the patient extend the knee and then putting downward pressure and loading pressure.  Skin:    General: Skin is warm and dry.  Neurological:     General: No focal deficit present.     Mental Status: She is  oriented to person, place, and time.  Psychiatric:        Mood and Affect: Mood normal.     ED Results / Procedures / Treatments   Labs (all labs ordered are listed, but only abnormal results are displayed) Labs Reviewed - No data to display  EKG None  Radiology DG Knee Complete 4 Views Left  Result Date: 05/19/2023 CLINICAL DATA:  Left knee pain.  Hyperextension. EXAM: LEFT KNEE - COMPLETE 4+ VIEW COMPARISON:  None Available. FINDINGS: There is no acute fracture or dislocation. The bones are osteopenic. Mild arthritic changes the knee. No joint effusion. The soft tissues are unremarkable. IMPRESSION: 1. No acute fracture or dislocation. 2. Osteopenia and mild arthritic changes. Electronically Signed   By: Elgie Collard M.D.   On: 05/19/2023 20:30    Procedures Procedures    Medications Ordered in ED Medications  acetaminophen (TYLENOL) tablet 1,000 mg (1,000 mg Oral Given 05/19/23 2112)    ED Course/ Medical Decision Making/ A&P                             Medical Decision Making  Patient had mild knee discomfort for a couple of days but no swelling or redness.  Sudden locking and giving out of the knee this afternoon.  At this time most likely suspect internal derangement such as meniscal tear.  Low suspicion for septic joint.  Patient has no effusion on exam and no erythema.  Possible degenerative changes and ligamentous instability.  Patient has good spontaneous range of motion seated position she can easily fully extend the knee and flex the knee.  Pain is with weightbearing.  X-ray reviewed by radiology no acute injury pattern present on knee x-rays.  This time without any fracture identified and low mechanism for fracture, I suspect meniscal tear or internal derangement with partial ligamentous tear.  Patient's knee is stable in seated position.  I will have her wear short knee brace at all times while ambulating and use a walker with only light weightbearing.  Will be  for elevating and extra strength Tylenol for pain.  I recommend follow-up with orthopedics.  Patient voices understanding.        Final Clinical Impression(s) / ED Diagnoses Final diagnoses:  Internal derangement of left knee    Rx / DC Orders ED Discharge Orders     None         Arby Barrette, MD 05/19/23 2124

## 2023-05-19 NOTE — ED Triage Notes (Signed)
BIBA from home for left knee pain. Pt states she was walking and hyperextended the left knee, 10/10 pain when bearing weight.  144/80 BP 104 HR 96% room air 88 cbg

## 2023-05-19 NOTE — Discharge Instructions (Signed)
1.  At this time I suspect you have a meniscus tear.  This is an injury to a thick cartilage pad in your knee.  Wear the knee immobilizer when you are up and trying to walk.  Use a walker or cane to bear weight lightly as tolerated. 2.  Try to elevate your leg is much as possible and place a well wrapped ice pack for about 20 minutes every 2 hours for the next several days.  You may take extra strength Tylenol every 6 hours as needed for pain. 3.  You should follow-up with an orthopedic specialist.  If you have a physician that you see for orthopedics, schedule your follow-up within the next 1 to 2 weeks.  If you do not have one Dr. Ave Filter at [facility] orthopedics has been included in your discharge instructions to contact for follow-up appointment.

## 2023-05-26 DIAGNOSIS — M25562 Pain in left knee: Secondary | ICD-10-CM | POA: Diagnosis not present

## 2023-08-11 DIAGNOSIS — H26491 Other secondary cataract, right eye: Secondary | ICD-10-CM | POA: Diagnosis not present

## 2023-10-01 DIAGNOSIS — E039 Hypothyroidism, unspecified: Secondary | ICD-10-CM | POA: Diagnosis not present

## 2023-10-01 DIAGNOSIS — I129 Hypertensive chronic kidney disease with stage 1 through stage 4 chronic kidney disease, or unspecified chronic kidney disease: Secondary | ICD-10-CM | POA: Diagnosis not present

## 2023-10-01 DIAGNOSIS — E7800 Pure hypercholesterolemia, unspecified: Secondary | ICD-10-CM | POA: Diagnosis not present

## 2023-10-01 DIAGNOSIS — Z23 Encounter for immunization: Secondary | ICD-10-CM | POA: Diagnosis not present

## 2023-10-01 DIAGNOSIS — M109 Gout, unspecified: Secondary | ICD-10-CM | POA: Diagnosis not present

## 2023-10-01 DIAGNOSIS — Z79899 Other long term (current) drug therapy: Secondary | ICD-10-CM | POA: Diagnosis not present

## 2023-10-01 DIAGNOSIS — Z9989 Dependence on other enabling machines and devices: Secondary | ICD-10-CM | POA: Diagnosis not present

## 2023-10-01 DIAGNOSIS — E559 Vitamin D deficiency, unspecified: Secondary | ICD-10-CM | POA: Diagnosis not present

## 2023-10-01 DIAGNOSIS — Z0000 Encounter for general adult medical examination without abnormal findings: Secondary | ICD-10-CM | POA: Diagnosis not present

## 2023-10-13 DIAGNOSIS — J069 Acute upper respiratory infection, unspecified: Secondary | ICD-10-CM | POA: Diagnosis not present

## 2023-11-14 DIAGNOSIS — Z1231 Encounter for screening mammogram for malignant neoplasm of breast: Secondary | ICD-10-CM | POA: Diagnosis not present

## 2023-12-01 DIAGNOSIS — R922 Inconclusive mammogram: Secondary | ICD-10-CM | POA: Diagnosis not present

## 2024-03-31 DIAGNOSIS — I129 Hypertensive chronic kidney disease with stage 1 through stage 4 chronic kidney disease, or unspecified chronic kidney disease: Secondary | ICD-10-CM | POA: Diagnosis not present

## 2024-03-31 DIAGNOSIS — E559 Vitamin D deficiency, unspecified: Secondary | ICD-10-CM | POA: Diagnosis not present

## 2024-03-31 DIAGNOSIS — L821 Other seborrheic keratosis: Secondary | ICD-10-CM | POA: Diagnosis not present

## 2024-08-23 DIAGNOSIS — H18453 Nodular corneal degeneration, bilateral: Secondary | ICD-10-CM | POA: Diagnosis not present

## 2024-08-23 DIAGNOSIS — H26491 Other secondary cataract, right eye: Secondary | ICD-10-CM | POA: Diagnosis not present
